# Patient Record
Sex: Male | Born: 1940 | Race: White | Hispanic: No | Marital: Married | Smoking: Never smoker
Health system: Southern US, Community
[De-identification: ages and names within clinical notes are randomized; demographics above are authoritative.]

## PROBLEM LIST (undated history)

## (undated) DIAGNOSIS — G20 Parkinson's disease: Secondary | ICD-10-CM

## (undated) DIAGNOSIS — N400 Enlarged prostate without lower urinary tract symptoms: Secondary | ICD-10-CM

## (undated) DIAGNOSIS — G20A1 Parkinson's disease without dyskinesia, without mention of fluctuations: Secondary | ICD-10-CM

## (undated) DIAGNOSIS — E7800 Pure hypercholesterolemia, unspecified: Secondary | ICD-10-CM

## (undated) DIAGNOSIS — I10 Essential (primary) hypertension: Secondary | ICD-10-CM

## (undated) DIAGNOSIS — F329 Major depressive disorder, single episode, unspecified: Secondary | ICD-10-CM

## (undated) DIAGNOSIS — J45909 Unspecified asthma, uncomplicated: Secondary | ICD-10-CM

## (undated) DIAGNOSIS — M419 Scoliosis, unspecified: Secondary | ICD-10-CM

## (undated) DIAGNOSIS — H409 Unspecified glaucoma: Secondary | ICD-10-CM

## (undated) DIAGNOSIS — F32A Depression, unspecified: Secondary | ICD-10-CM

## (undated) DIAGNOSIS — I519 Heart disease, unspecified: Secondary | ICD-10-CM

## (undated) HISTORY — PX: MITRAL VALVE REPAIR: SHX2039

## (undated) HISTORY — PX: HERNIA REPAIR: SHX51

## (undated) HISTORY — DX: Unspecified asthma, uncomplicated: J45.909

## (undated) HISTORY — DX: Scoliosis, unspecified: M41.9

## (undated) HISTORY — DX: Heart disease, unspecified: I51.9

## (undated) HISTORY — DX: Pure hypercholesterolemia, unspecified: E78.00

## (undated) HISTORY — DX: Major depressive disorder, single episode, unspecified: F32.9

## (undated) HISTORY — DX: Depression, unspecified: F32.A

## (undated) HISTORY — PX: REFRACTIVE SURGERY: SHX103

## (undated) HISTORY — DX: Benign prostatic hyperplasia without lower urinary tract symptoms: N40.0

---

## 1999-10-26 HISTORY — PX: CARDIAC CATHETERIZATION: SHX172

## 2000-06-03 ENCOUNTER — Ambulatory Visit (HOSPITAL_COMMUNITY): Admission: RE | Admit: 2000-06-03 | Discharge: 2000-06-03 | Payer: Self-pay | Admitting: Cardiology

## 2000-06-24 ENCOUNTER — Encounter: Payer: Self-pay | Admitting: Cardiothoracic Surgery

## 2000-06-29 ENCOUNTER — Inpatient Hospital Stay (HOSPITAL_COMMUNITY): Admission: RE | Admit: 2000-06-29 | Discharge: 2000-07-13 | Payer: Self-pay | Admitting: Cardiothoracic Surgery

## 2000-06-29 ENCOUNTER — Encounter: Payer: Self-pay | Admitting: Cardiothoracic Surgery

## 2000-06-29 ENCOUNTER — Encounter (INDEPENDENT_AMBULATORY_CARE_PROVIDER_SITE_OTHER): Payer: Self-pay | Admitting: Specialist

## 2000-06-30 ENCOUNTER — Encounter: Payer: Self-pay | Admitting: Cardiothoracic Surgery

## 2000-07-01 ENCOUNTER — Encounter: Payer: Self-pay | Admitting: Cardiothoracic Surgery

## 2000-07-02 ENCOUNTER — Encounter: Payer: Self-pay | Admitting: Cardiothoracic Surgery

## 2000-07-03 ENCOUNTER — Encounter: Payer: Self-pay | Admitting: Cardiothoracic Surgery

## 2000-07-05 ENCOUNTER — Encounter: Payer: Self-pay | Admitting: Cardiothoracic Surgery

## 2000-07-06 ENCOUNTER — Encounter: Payer: Self-pay | Admitting: Cardiothoracic Surgery

## 2000-07-08 ENCOUNTER — Encounter: Payer: Self-pay | Admitting: Cardiothoracic Surgery

## 2000-07-11 ENCOUNTER — Encounter: Payer: Self-pay | Admitting: Cardiothoracic Surgery

## 2000-07-31 ENCOUNTER — Encounter: Payer: Self-pay | Admitting: Cardiothoracic Surgery

## 2000-07-31 ENCOUNTER — Emergency Department (HOSPITAL_COMMUNITY): Admission: EM | Admit: 2000-07-31 | Discharge: 2000-07-31 | Payer: Self-pay | Admitting: Emergency Medicine

## 2000-08-01 ENCOUNTER — Encounter: Payer: Self-pay | Admitting: Cardiothoracic Surgery

## 2000-08-01 ENCOUNTER — Ambulatory Visit (HOSPITAL_COMMUNITY): Admission: RE | Admit: 2000-08-01 | Discharge: 2000-08-01 | Payer: Self-pay | Admitting: Cardiothoracic Surgery

## 2000-08-02 ENCOUNTER — Encounter (HOSPITAL_COMMUNITY): Admission: RE | Admit: 2000-08-02 | Discharge: 2000-10-31 | Payer: Self-pay | Admitting: Cardiology

## 2000-08-11 ENCOUNTER — Encounter: Admission: RE | Admit: 2000-08-11 | Discharge: 2000-08-11 | Payer: Self-pay | Admitting: Cardiothoracic Surgery

## 2000-08-11 ENCOUNTER — Encounter: Payer: Self-pay | Admitting: Cardiothoracic Surgery

## 2000-08-18 ENCOUNTER — Encounter: Admission: RE | Admit: 2000-08-18 | Discharge: 2000-08-18 | Payer: Self-pay | Admitting: Cardiothoracic Surgery

## 2000-08-18 ENCOUNTER — Encounter: Payer: Self-pay | Admitting: Cardiothoracic Surgery

## 2000-08-21 ENCOUNTER — Emergency Department (HOSPITAL_COMMUNITY): Admission: EM | Admit: 2000-08-21 | Discharge: 2000-08-21 | Payer: Self-pay | Admitting: Emergency Medicine

## 2000-11-01 ENCOUNTER — Encounter (HOSPITAL_COMMUNITY): Admission: RE | Admit: 2000-11-01 | Discharge: 2000-11-06 | Payer: Self-pay | Admitting: Cardiology

## 2000-11-07 ENCOUNTER — Encounter (HOSPITAL_COMMUNITY): Admission: RE | Admit: 2000-11-07 | Discharge: 2001-02-05 | Payer: Self-pay | Admitting: Cardiology

## 2001-02-06 ENCOUNTER — Encounter: Admission: RE | Admit: 2001-02-06 | Discharge: 2001-05-07 | Payer: Self-pay | Admitting: Cardiology

## 2001-05-08 ENCOUNTER — Encounter (HOSPITAL_COMMUNITY): Admission: RE | Admit: 2001-05-08 | Discharge: 2001-08-06 | Payer: Self-pay | Admitting: Cardiology

## 2001-08-07 ENCOUNTER — Encounter (HOSPITAL_COMMUNITY): Admission: RE | Admit: 2001-08-07 | Discharge: 2001-11-05 | Payer: Self-pay | Admitting: Cardiology

## 2003-05-09 ENCOUNTER — Ambulatory Visit (HOSPITAL_COMMUNITY): Admission: RE | Admit: 2003-05-09 | Discharge: 2003-05-09 | Payer: Self-pay | Admitting: Internal Medicine

## 2003-07-24 ENCOUNTER — Ambulatory Visit (HOSPITAL_COMMUNITY): Admission: RE | Admit: 2003-07-24 | Discharge: 2003-07-24 | Payer: Self-pay | Admitting: Gastroenterology

## 2003-07-24 ENCOUNTER — Encounter (INDEPENDENT_AMBULATORY_CARE_PROVIDER_SITE_OTHER): Payer: Self-pay | Admitting: *Deleted

## 2003-09-12 ENCOUNTER — Encounter: Admission: RE | Admit: 2003-09-12 | Discharge: 2003-12-11 | Payer: Self-pay | Admitting: Neurology

## 2003-12-19 ENCOUNTER — Ambulatory Visit (HOSPITAL_COMMUNITY): Admission: RE | Admit: 2003-12-19 | Discharge: 2003-12-19 | Payer: Self-pay | Admitting: Neurology

## 2004-07-10 ENCOUNTER — Encounter: Admission: RE | Admit: 2004-07-10 | Discharge: 2004-09-10 | Payer: Self-pay | Admitting: Neurology

## 2005-01-21 ENCOUNTER — Ambulatory Visit: Payer: Self-pay | Admitting: Internal Medicine

## 2005-08-06 ENCOUNTER — Ambulatory Visit: Payer: Self-pay | Admitting: Internal Medicine

## 2005-08-24 ENCOUNTER — Ambulatory Visit (HOSPITAL_COMMUNITY): Admission: RE | Admit: 2005-08-24 | Discharge: 2005-08-24 | Payer: Self-pay | Admitting: Neurology

## 2006-12-02 ENCOUNTER — Ambulatory Visit: Payer: Self-pay | Admitting: Internal Medicine

## 2007-11-09 DIAGNOSIS — J209 Acute bronchitis, unspecified: Secondary | ICD-10-CM

## 2007-11-10 ENCOUNTER — Ambulatory Visit: Payer: Self-pay | Admitting: Internal Medicine

## 2007-11-11 DIAGNOSIS — J309 Allergic rhinitis, unspecified: Secondary | ICD-10-CM | POA: Insufficient documentation

## 2008-02-26 ENCOUNTER — Telehealth: Payer: Self-pay | Admitting: Internal Medicine

## 2008-03-25 ENCOUNTER — Telehealth: Payer: Self-pay | Admitting: Internal Medicine

## 2008-10-09 ENCOUNTER — Ambulatory Visit: Payer: Self-pay | Admitting: Internal Medicine

## 2009-01-15 ENCOUNTER — Encounter: Admission: RE | Admit: 2009-01-15 | Discharge: 2009-04-15 | Payer: Self-pay | Admitting: Neurology

## 2009-02-24 ENCOUNTER — Ambulatory Visit: Payer: Self-pay | Admitting: Psychology

## 2009-09-10 ENCOUNTER — Encounter: Admission: RE | Admit: 2009-09-10 | Discharge: 2009-09-10 | Payer: Self-pay | Admitting: Orthopaedic Surgery

## 2009-10-08 ENCOUNTER — Encounter: Admission: RE | Admit: 2009-10-08 | Discharge: 2009-10-08 | Payer: Self-pay | Admitting: Neurology

## 2009-11-14 ENCOUNTER — Telehealth (INDEPENDENT_AMBULATORY_CARE_PROVIDER_SITE_OTHER): Payer: Self-pay | Admitting: *Deleted

## 2010-02-25 ENCOUNTER — Encounter: Admission: RE | Admit: 2010-02-25 | Discharge: 2010-05-26 | Payer: Self-pay | Admitting: Neurology

## 2010-04-22 ENCOUNTER — Encounter: Admission: RE | Admit: 2010-04-22 | Discharge: 2010-04-22 | Payer: Self-pay | Admitting: Orthopaedic Surgery

## 2010-11-18 ENCOUNTER — Encounter
Admission: RE | Admit: 2010-11-18 | Discharge: 2010-11-18 | Payer: Self-pay | Source: Home / Self Care | Attending: Internal Medicine | Admitting: Internal Medicine

## 2010-11-24 NOTE — Progress Notes (Signed)
Summary: Record request  Request for records received from [facility]. Request forwarded to [facility]. Wilder Glade  November 14, 2009 4:11 PM

## 2010-11-24 NOTE — Assessment & Plan Note (Signed)
Summary: FOLLOW-UP/TL.   Visit Type:  Follow-up PCP:  Kirby Funk  Chief Complaint:  follow up.  History of Present Illness: Asthma Allergic rhinitis   F/U OK. Goes weeks without wheeze, using rescue inhaler only occasionally. Parkinson's stable- Dr. Sandria Manly- some short term memory loss. Also wants to schedule sleep study at their program. Still sternal sore with sneeze at old sternotomy site Denies routine sneeze, cough or phlegm Denies cardiac complaints    Current Allergies (reviewed today): ! 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    Risk Factors:  Tobacco use:  never   Review of Systems      See HPI   Vital Signs:  Patient Profile:   70 Years Old Male Weight:      166.38 pounds O2 Sat:      99 % O2 treatment:    Room Air Pulse rate:   61 / minute BP sitting:   106 / 72  (left arm) Cuff size:   regular  Vitals Entered By: Reynaldo Minium CMA (November 10, 2007 9:20 AM)             Comments Medications reviewed with patient ..................................................................Marland KitchenReynaldo Minium CMA  November 10, 2007 9:20 AM     Physical Exam  General:     normal appearance.  affect brighter than in the past.  Head:     normocephalic and atraumatic facial expression a little flat. Nose:     no deformity, discharge, inflammation, or lesions Mouth:     no deformity or lesions Neck:     no masses, thyromegaly, or abnormal cervical nodes Chest Wall:     sternotomy scar Lungs:     clear bilaterally to auscultation and percussion Heart:     regular rate and rhythm, S1, S2 without murmurs, rubs,  gallops, or clicks Extremities:     no clubbing, cyanosis, edema, or deformity noted Skin:     intact without lesions or rashes Cervical Nodes:     no significant adenopathy     Impression & Recommendations:  Problem # 1:  ASTHMATIC BRONCHITIS, ACUTE (ICD-466.0) Assessment: Improved  His updated medication list for this problem includes:    Proventil Hfa 108 (90 Base) Mcg/act Aers (Albuterol sulfate) .Marland Kitchen... 2 puffs, four times daily as needed  Orders: Est. Patient Level III (56213)   Problem # 2:  ALLERGIC RHINITIS (ICD-477.9) Assessment: Improved  His updated medication list for this problem includes:    Fluticasone Propionate 50 Mcg/act Susp (Fluticasone propionate) .Marland Kitchen... 1 spray each nostril daily  Orders: Est. Patient Level III (08657)   Medications Added to Medication List This Visit: 1)  Fluticasone Propionate 50 Mcg/act Susp (Fluticasone propionate) .Marland Kitchen.. 1 spray each nostril daily 2)  Proventil Hfa 108 (90 Base) Mcg/act Aers (Albuterol sulfate) .... 2 puffs, four times daily as needed   Patient Instructions: 1)  Please schedule a follow-up appointment in 1 year.    Prescriptions: FLUTICASONE PROPIONATE 50 MCG/ACT  SUSP (FLUTICASONE PROPIONATE) 1 spray each  nostril daily  #3 x 3   Entered and Authorized by:   Waymon Budge MD   Signed by:   Waymon Budge MD on 11/10/2007   Method used:   Printed then faxed to ...         RxID:   [id number] PROVENTIL HFA 108 (90 BASE) MCG/ACT  AERS (ALBUTEROL SULFATE) 2 puffs, four times daily as needed  #3 x 3   Entered and Authorized by:   Waymon Budge MD   Signed by:   Waymon Budge MD on 11/10/2007   Method used:   Printed then faxed to ...         RxID:   [id number]  ]

## 2010-11-27 ENCOUNTER — Encounter (HOSPITAL_COMMUNITY): Payer: MEDICARE

## 2010-11-27 ENCOUNTER — Ambulatory Visit (HOSPITAL_COMMUNITY)
Admission: RE | Admit: 2010-11-27 | Discharge: 2010-11-27 | Disposition: A | Discharge status: Home / Self Care | Payer: MEDICARE | Source: Ambulatory Visit | Attending: Surgery | Admitting: Surgery

## 2010-11-27 DIAGNOSIS — Z01818 Encounter for other preprocedural examination: Secondary | ICD-10-CM | POA: Insufficient documentation

## 2010-11-27 LAB — CBC
MCH: 30.4 pg (ref 26.0–34.0)
MCHC: 33.2 g/dL (ref 30.0–36.0)
MCV: 91.8 fL (ref 78.0–100.0)
Platelets: 158 10*3/uL (ref 150–400)
RBC: 4.5 MIL/uL (ref 4.22–5.81)

## 2010-11-27 LAB — COMPREHENSIVE METABOLIC PANEL
Albumin: 3.7 g/dL (ref 3.5–5.2)
BUN: 20 mg/dL (ref 6–23)
CO2: 31 mEq/L (ref 19–32)
Calcium: 9.3 mg/dL (ref 8.4–10.5)
Chloride: 103 mEq/L (ref 96–112)
Creatinine, Ser: 0.93 mg/dL (ref 0.4–1.5)
GFR calc Af Amer: >60 mL/min (ref >60)
GFR calc non Af Amer: >60 mL/min (ref >60)
Total Bilirubin: 1 mg/dL (ref 0.3–1.2)

## 2010-11-27 LAB — SURGICAL PCR SCREEN: MRSA, PCR: NEGATIVE

## 2010-12-04 ENCOUNTER — Ambulatory Visit (HOSPITAL_COMMUNITY)
Admission: RE | Admit: 2010-12-04 | Discharge: 2010-12-04 | Disposition: A | Discharge status: Home / Self Care | Payer: MEDICARE | Source: Ambulatory Visit | Attending: Surgery | Admitting: Surgery

## 2010-12-04 ENCOUNTER — Other Ambulatory Visit (HOSPITAL_COMMUNITY): Payer: Self-pay

## 2010-12-04 DIAGNOSIS — G20A1 Parkinson's disease without dyskinesia, without mention of fluctuations: Secondary | ICD-10-CM | POA: Insufficient documentation

## 2010-12-04 DIAGNOSIS — D176 Benign lipomatous neoplasm of spermatic cord: Secondary | ICD-10-CM | POA: Insufficient documentation

## 2010-12-04 DIAGNOSIS — K4090 Unilateral inguinal hernia, without obstruction or gangrene, not specified as recurrent: Secondary | ICD-10-CM | POA: Insufficient documentation

## 2010-12-04 DIAGNOSIS — Z01812 Encounter for preprocedural laboratory examination: Secondary | ICD-10-CM | POA: Insufficient documentation

## 2010-12-04 DIAGNOSIS — J45909 Unspecified asthma, uncomplicated: Secondary | ICD-10-CM | POA: Insufficient documentation

## 2010-12-04 DIAGNOSIS — G20 Parkinson's disease: Secondary | ICD-10-CM | POA: Insufficient documentation

## 2010-12-07 NOTE — Op Note (Signed)
[name] [name], [name] [name]                ACCOUNT NO.:  [id number]  MEDICAL RECORD NO.:  [id number]           PATIENT TYPE:  O  LOCATION:  XRAY                         FACILITY:  [facility]  PHYSICIAN:  Ardeth Sportsman, MD     DATE OF BIRTH:  05/25/1941  DATE OF PROCEDURE: DATE OF DISCHARGE:  11/27/2010                              OPERATIVE REPORT   PRIMARY CARE PHYSICIAN:  Thora Lance, M.D.  NEUROLOGIST:  Genene Churn. Love, M.D.  ANESTHESIOLOGIST:  Hezzie Bump. Okey Dupre, M.D.  SURGEON:  Ardeth Sportsman, MD  ASSISTANT:  RN.  PREOPERATIVE DIAGNOSIS:  Right inguinal hernia.  POSTOPERATIVE DIAGNOSIS:  Right indirect inguinal hernia.  PROCEDURE PERFORMED:  Laparoscopic right inguinal hernia repair with mesh.  ANESTHESIA: 1. General anesthesia using total intravenous anesthesia method per     Dr. Okey Dupre and Dr. Imagene Gurney recommendations. 2. Right ilioinguinal/genitofemoral/spermatic cord nerve root blocks. 3. Local anesthetic in a field block around all port sites.  SPECIMENS:  None.  DRAINS:  None.  ESTIMATED BLOOD LOSS:  Minimal.  COMPLICATIONS:  None apparent.  INDICATIONS:  [name] [name] is a 70 year old retired gentleman who has had some right groin pain and swelling that is increased, became uncomfortable.  He had evaluation concerning for inguinal hernia.  The patient was sent to me and I confirmed that.  Anatomy and embryology of abdominal formation and testicular migration was discussed.  Pathophysiology of inguinal herniation with its natural risks were discussed.  Options discussed.  Recommendations made for hernia repair.  Laparoscopic versus open techniques were discussed.  I had a discussion with the patient and his wife, and his neurologist, Dr. Sandria Manly.  Given the fact that he had lot of discomfort, I was skeptical that local anesthetic could be sufficient, he probably will need to undergo general anesthesia.  They were understandably concerned with his Parkinson's disease  because I feel there is a postoperative advanced and decreased pain and narcotic use postoperatively doing laparoscopic technique.  I discussed with the neurologist and Dr. Sandria Manly who thought it would be reasonable.  Risks, benefits, and alternatives were discussed. Questions answered and agreed to proceed.  OPERATIVE FINDINGS:  He had moderate-sized indirect inguinal hernia with a spermatic cord lipoma lead point.  He had no direct femoral or obturator defects.  DESCRIPTION OF PROCEDURE:  Informed consent was confirmed.  The patient voided prior to going to the operating room.  He underwent general anesthesia without any difficulty.  He received IV antibiotics.  He was positioned supine with both arms tucked.  He had received his ReQuip and Sinemet, neurological medications this morning.  His abdomen and mons pubis were clipped, prepped and draped in sterile fashion.  Surgical time out confirmed the plan.  I placed a 12 mm Hasson port posterior to the left rectus abdominis muscle using a modified Hasson technique.  Port was placed in the preperitoneal plane.  Induced carbon dioxide insufflation, used the camera to free peritoneum off the lower abdomen.  I created a working space such that 5 mm ports were placed in the right and left mid- abdomen.  I turned attention towards the  right lower quadrant.  I freed the peritoneum off towards the right flank.  I freed the peritoneum up to the pubic rim and freed the right anteromedial bladder wall off the bladder of the true pelvic sidewall.  I can see a swath of peritoneum going up into a dilated internal ring, consistent with an indirect inguinal hernia.  I skeletonized the hernia sac off the spermatic cord structures and peeled down.  In peeling down, there was a very thin doubt area of peritoneum and there was breakdown and an opening in the peritoneal space.  I did confirm there was no incarceration or abnormality in the peritoneal  cavity itself. Eventually, I skeletonized the hernia sac off spermatic cord muscles and reduced it down.  There was a spermatic cord lipoma lead point that was reduced down with it, that was removed.  I peeled the hernia sac in the peritoneum off the cord structures and vas deferens and psoas muscle and retroperitoneum as proximally as possible.  I did free the peritoneum off the right midabdomen as well.  I did a high ligation of the hernia sac as well as closing the peritoneal opening using 3-0 Vicryl laparoscopic intracorporeal suturing, figure-of-eight x10.  I chose a 15 x 15 cm ultra lightweight polypropylene mesh (Ultrapro).  I cut a slit in the mesh such that a 6 x 6 cm medial and inferior corner rest in the true pelvis between the anterolateral bladder and the pelvic sidewall.  It laid in the diamond pattern such that the medial corner overlapped across the midline towards the left posterior to the superior ramus.  The mesh corners laid well laterally, superiorly, and inferiorly such that there was over 3 inches circumferential coverage around the internal ring.  Direct space and femoral and obturator foramen were covered with this as well.  I saw no other defects.  I held the mesh in place and capnoperitoneum was evacuated with ports. I did evacuate peritoneum out of the perirenal space by making a small nick in the peritoneum at the umbilical stalk away from the mesh.  I closed the infraumbilical fascial defect using 0-Vicryl stitch.  Skin was closed.  Steri-Strips were applied.  The patient was extubated and sent to recovery room in stable condition. Total anesthesia time was less than an hour.  I discussed postoperative care in detail with the patient and his wife in the office.  I discussed it in the holding area as well.  I have written instructions.  I am about to discuss this with his wife as well. We will get him back on his neurological/Parkinson's medical regimen  as soon as possible.     Ardeth Sportsman, MD     SCG/MEDQ  D:  12/04/2010  T:  12/05/2010  Job:  [id number]  Electronically Signed by Karie Soda MD on 12/07/2010 02:16:38 PM

## 2011-03-12 NOTE — Assessment & Plan Note (Signed)
[facility]                             PULMONARY OFFICE NOTE   NAME:[name], [name]                         MRN:          [id number]  DATE:12/02/2006                            DOB:          12-04-1940    PROBLEM:  1. Asthma/chronic obstructive pulmonary disease.  2. Parkinson's.   HISTORY:  Last seen in 2006.  He has had a previous mitral valve repair.  He and his wife returned from [place] last week with chest colds.  She  got over hers.  He has had more significant congestion, wheezing and  head congestion.  Dr. Valentina Lucks gave him prednisone 40 mg daily for 7  days, and an Asmanex inhaler, but his stuffy nose is now keeping her  awake.  I think that is what she is hearing rather than wheezing.  His  appetite is down just a little, but he has had no significant problems  from the prednisone, and he denies fever, chills, aches or phlegm.  There has been very minimal epistaxis.  Cough produces scant amounts of  trace yellow to white sputum.   MEDICATIONS:  1. Nasacort AQ.  2. Aspirin 81 mg.  3. Multivitamins.  4. Zoloft.  5. Toprol XL 25 mg.  6. Levbid 0.375 mg.  7. Flomax.  8. Requip 5 mg t.i.d.  9. Provigil 100 mg.  10.Lipitor 10 mg.  11.Asmanex 2 puffs daily.  12.He has 1 day left on his prednisone.  13.Rescue albuterol inhaler used occasionally.   NO MEDICATION ALLERGY, BUT HE AVOIDS ANTIHISTAMINES, WHICH TEND TO CAUSE  HALLUCINATION.  This was an issue in discussing cough medications.   OBJECTIVE:  Weight 160 pounds.  BP 128/70.  Pulse regular 59.  Room air  saturation 97%.  There is mild nasal congestion and a congested cough, otherwise lungs  are quiet and breathing is unlabored.  HEART SOUNDS:  Regular.  No edema.  No stridor.   IMPRESSION:  Rhinitis and bronchitis with a viral syndrome.   PLAN:  1. Z-Pak with discussion, to hold.  2. Mucinex.  3. Short-term use of Afrin or nasal strips at night if necessary.  4. Saline  nasal lavage.  5. Tessalon Pearls 100 mg q. 6 hours p.r.n. for cough.  6. Schedule return in 1 year, earlier p.r.n.     Clinton D. Maple Hudson, MD, Tonny Bollman, FACP  Electronically Signed    CDY/MedQ  DD: 12/03/2006  DT: 12/03/2006  Job #: [id number]   cc:   Thora Lance, M.D.

## 2011-03-12 NOTE — Consult Note (Signed)
[facility]  Patient:    [name] [name], [name] [name]                         MRN: [id number] Adm. Date:  [id number] Attending:  Waldo Laine CC:         Gwenith Daily. Tyrone Sage, M.D.  Thora Lance, M.D.   Consultation Report  REFERRING PHYSICIAN:  Gwenith Daily. Tyrone Sage, M.D.  REASON FOR CONSULTATION:  Postoperative dyspnea.  HISTORY OF PRESENT ILLNESS:  This is a very complicated 70 year old white male never-smoker, who carries a diagnosis of asthma and has previously been followed by Dr. Stevphen Rochester but has not seen him for over five years, when the patient declined taking further allergy shots.  He has been maintained on Serevent two puffs b.i.d. with rare flare-ups, the last being over a year ago, requiring a course of prednisone.  He is now six days status post mitral valve replacement with normal postoperative hemodynamics and worsening dyspnea with a sensation of "my asthma is bad" over the last several days.  He is 50% better after using a nebulizer treatment with albuterol but no more than that. He has a dry cough with deep inspiratory maneuvers but no pleuritic pain and no associated leg swelling.  He also has orthopnea with immediate PND.  He denies any nocturnal wheeze and has no sputum production, sinus complaints, or reflux symptoms.  I note that preoperatively he had a wedge pressure of 12 with a V-wave of only 18, and PA pressure of 28/11.  PAST MEDICAL HISTORY: 1. Asthma as noted above. 2. Detached retina requiring surgery remotely. 3. Hypertension. 4. Allergic rhinitis. 5. BPH. 6. Status post tonsillectomy and adenoidectomy as an infant.  MEDICATIONS:  Prior to admission, he was maintained on Serevent two puffs b.i.d., Cardura 4 mg q.d., and Rhinocort one or two q.d.  Medications were reviewed and at this point include Lopressor 25 mg b.i.d., iron sulfate 325 mg b.i.d., Colace one daily, hyoscyamine 0.375 mg q.d., Dulcolax  one daily, Pepcid 20 mg b.i.d., Epogen 40,000 units every Thursday, and Cipro 500 mg b.i.d. started on the 10th.  ALLERGIES:  No known medical allergies.  He is "allergic to lots of things," according to previous allergy testing.  SOCIAL HISTORY:  He has never smoked.  He does have a dog inside.  FAMILY HISTORY:  Negative for atopy or respiratory diseases.  REVIEW OF SYSTEMS:  Taken in detail and essentially negative except as noted above.  PHYSICAL EXAMINATION:  GENERAL:  This is an anxious but pleasant white male in no acute distress.  VITAL SIGNS:  He is afebrile with normal vital signs.  His T-max over the last 24 hours is 100.3.  HEENT:  Unremarkable.  Oropharynx is clear.  No evidence of postnasal drainage or cobblestoning.  NECK:  Supple without cervical adenopathy or tenderness.  Trachea is midline.  LUNGS:  Lung fields reveal relatively short inspiratory and expiratory time with no significant expiratory prolongation or wheezing.  There is dullness in both bases with cough elicited on deep inspiratory maneuvers.  CARDIAC:  Regular rate and rhythm with a 1/6 systolic ejection murmur.  ABDOMEN:  Soft and benign with no palpable ascites and negative Hoover sign.  EXTREMITIES:  Warm without calf tenderness, cyanosis, clubbing, or edema.  NEUROLOGIC:  No focal deficits or pathologic reflexes, although his affect was quite anxious.  SKIN:  Warm and dry.  DIAGNOSTIC EVALUATION:  Chest x-ray was reviewed from  today and reveals moderate bilateral pleural effusions.  Hematocrit is 23% today with no elevation of WBC, and BMET is normal, with an INR of 2.2.  His most recent hematocrit was 21.9% obtained on September 9.  The hemoglobin saturation presently is 96% on 2 L nasal prongs.  IMPRESSION: 1. Chronic mild asthma, previously controlled on Serevent two puffs b.i.d.,    with rare flare-ups.  In retrospect, he has done quite well.  However, I    think there was  probably a misunderstanding between the patient and Dr.    Stevphen Rochester (although the patient denies this) in that Serevent is not    typically used by itself because of the risk of hiding inflammation and    setting him up for asthma flare-ups.  Clearly he has been fortunate not to    have had more flare-ups requiring prednisone over the last year.  On the    other hand, I do not believe he is having a significant flare-up now and do    not believe aggressive treatment for asthma is needed.  I would recommend,    however, for future reference using Serevent combined with fluticasone in    the form of Advair one puff b.i.d. dosed for now at 250/51 b.i.d. but    certainly could be tapered down to the 100/50 b.i.d. dosing on his next    refill (which I will defer to Dr. Valentina Lucks unless the patient wishes    outpatient follow-up through our office). 2. Dyspnea I believe is disproportionate to air flow obstruction.  Note that    he only gets "half better" from high-dose albuterol and is not wheezing on    my present exam two hours after his last treatment.  I suspect the largest    component of dyspnea is related to anxiety, anemia, and pleural effusions. 3. Pleural effusion postoperatively, question etiology.  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  I do not    think there is any evidence, however, for congestive heart failure or    pulmonary embolism or pneumonia.  I recommend simply following his    sedimentation rate, doing incentive spirometry with continued    normalization, and following him clinically for now. 4. Postoperative atelectasis secondary to effusions is most likely cause of    persistent O2 dependency.  I would not treat this specifically other than    measures as above.  Ultimately, if he continues to have effusions prior to    discharge and hypoxemia, he  may benefit from therapeutic and diagnostic     thoracentesis.  However, I will defer this issue entirely to Dr. Ysidro Evert    capable hands. DD:  07/06/10 TD:  07/06/00 Job: [id number]

## 2011-03-12 NOTE — Discharge Summary (Signed)
[name]. [name]  Patient:    [name] [name], [name] [name]                         MRN: [id number] Adm. Date:  [id number] Disc. Date: 07/08/00 Attending:  Waldo Laine Dictator:   Loura Pardon, P.A.                           Discharge Summary  DATE OF BIRTH:  1941-01-06  CARDIOLOGIST:  Francisca December, M.D.  PRIMARY CAREGIVER:  Thora Lance, M.D.  PULMONOLOGIST:  Charlaine Dalton. Sherene Sires, M.D.  FINAL DIAGNOSES: 1. Severe mitral regurgitation (4+) with mitral prolapse. 2. Acute blood loss anemia postoperatively in part secondary to coagulopathy    requiring transfusions 4 units packed fresh frozen plasma, 10 units of    platelets on the day of surgery, 2 units of packed red blood cells, and 8    units of platelets on postoperative day #1. 3. Bilateral moderate pleural effusions, occasionally prolonged oxygen    support. 4. Sputum culture abundant Gram positive cocci in chains treated with Cipro. 5. Postoperative anxiety and mild confusion/hallucination with narcotic    analgesia.  SECONDARY DIAGNOSES: 1. Asthma/bronchospastic lung disease. 2. History of detached retina requiring surgery on both eyes 1999. 3. History of cataracts. 4. Hypertension. 5. Allergic rhinitis. 6. Benign prostatic hypertrophy. 7. Status post tonsillectomy and adenoidectomy. 8. Status post left heart catheterization June 03, 2000.  The study    demonstrated ejection fraction of 79%, 4+ mitral regurgitation, mitral    prolapse.  The left main was free of disease.  The left anterior descending    coronary artery had a 30% proximal stenosis in the left circumflex and    right coronary artery was normal.  PROCEDURES:  June 29, 2000 placement of 29 mm ______ annuloplasty ring-Dr. Sheliah Plane.  Patient did experience coagulopathy in the immediate postoperative period.  Required transfusions as mentioned above.  DISCHARGE DISPOSITION:  [name] [name] is judged a suitable  candidate for discharge postoperative day #8 as mentioned above.  He did require transfusions of blood products due to severe acute blood loss anemia. Occasioned intraoperatively and in the first day after surgery.  He was treated with Epogen injections twice seven days apart and with iron supplementation.  His hemoglobin reached a nadir which means low point of 7.7 on postoperative day #4.  It has been slowly increasing in the ensuing days. He has not experienced any cardiac dysrhythmias in the postoperative period. He did have a cardiac murmur which is now resolved after placement of annuloplasty ring.  He has required prolonged oxygen support which to all intents and purposes could be removed on postoperative day #8.  He is achieving 97% oxygen saturation on 2 L of nasal cannula.  When ambulating he is able to maintain 90-92% oxygen saturations on room air.  His gait is steady.  He is ambulating without assistance.  His appetite is still poor and he describes some trouble swallowing.  He says that he has had to take his pills with sherbet or pudding in order for them to be swallowed.  His voice is slightly hoarse.  He complains of a sore throat in the postoperative period. He has not had trouble swallowing preoperatively, however.  His swallow function has seemed to improve in the last two days prior to discharge.  He had elevated temperature on postoperative day #5  with productive green sputum. The sputum was cultured and demonstrated abundant Gram positive cocci in chains.  He was started on Cipro and will continue Cipro for an 11 day treatment.  He was seen by Casimiro Needle B. Sherene Sires, M.D., pulmonologist, in consultation.  On postoperative day #7 prolonged oxygen requirement is secondary in his opinion to chronic mild asthma.  Dr. Sherene Sires recommended using Serevent combined with fluticasone in the form of Advair one puff b.i.d.  It is also noted that he had moderate bilateral pleural effusions  which have, however, seemed to diminish on chest x-rays taken postoperative day #7 and 8. It is thought that he might have a hemothorax since he has history of falling hematocrit postoperatively.  There is no evidence for congestive heart failure, pulmonary embolism, or pneumonia.  Dr. Sherene Sires recommended following sedimentation rate, performing ______ spirometry, mobilizing, and following clinically.  It was his opinion that postoperative atelectasis was secondary to bilateral pleural effusions.  If he continues to have hypoxemia it was thought that [name] [name] might benefit from a therapeutic thoracentesis.  After his initial temperature elevation on postoperative day #5, he has not had any further febrile episodes.  [name] [name] was started on Coumadin for a three month period on postoperative day #3 and achieved therapeutic doses of Coumadin by postoperative day #7.  His Coumadin dose is 2.5 daily unless changed by dictation of Francisca December, M.D., cardiologist.  [name] [name] will report to Dr. Amil Amen office on Monday, September 17 to have anticoagulation levels in his blood checked and they will be checked serially until he obtains a stable PT/INR.  DISCHARGE ACTIVITY:  Ambulation as tolerated.  He is asked not to lift more than 10 pounds nor to drive for the next six weeks.  DISCHARGE MEDICATIONS:  1. Ultram 50 mg one to two tabs p.o. q.4-6h. p.r.n. pain.  2. Coumadin 2.5 mg daily or as directed by Dr. Amil Amen office.  3. Ferrous sulfate 325 mg p.o. b.i.d. with food.  4. Lopressor 50 mg one-half tab in the morning, one-half tab in evening.  5. Folic acid 1 mg daily.  6. Pepcid 20 mg p.o. b.i.d.  7. Cipro 500 mg b.i.d. for seven days of treatment.  8. Cardura 4 mg at bedtime daily.  9. Advair diskus 250/50 one puff b.i.d. 10. Humibid LA 600 mg two tabs p.o. b.i.d. 11. Ativan as needed for anxiety. 12. Rhinocort spray as needed.  Question of further diuresis with Lasix will be  postponed until the day of his discharge.  He has achieved preoperative weight and it is not felt that he has impaired left ventricular function or history of congestive heart failure to  justify continuing on a diuretic at present.  DISCHARGE DIET:  Low sodium, low cholesterol diet.  WOUND CARE:  He may shower daily, keeping his incisions clean and dry.  FOLLOW-UP:  He will see Dr. Amil Amen Monday, September 19 to have blood tested for anticoagulation levels.  He will also have an office visit with Dr. Amil Amen two weeks after discharge and he is asked to call [phone] to arrange that appointment.  Dr. Ysidro Evert office will call him with an appointment for three weeks after discharge.  He is asked to bring a chest x-ray taken at Dr. Amil Amen office to that appointment.  BRIEF HISTORY:  [name] [name] is a 70 year old male referred by Dr. Amil Amen to Dr. Sheliah Plane for management of mitral valve disease on Mar 24, 2000. Patient presented to the office of cardiovascular  thoracic surgeons seeking a second opinion regarding his mitral valve regurgitation.  He is a relatively asymptomatic male with known moderate mitral regurgitation and long-standing mitral valve prolapse.  He has been followed for the last year with a series of echocardiograms.  They have showed a late serial increase in the left ventricular end-diastolic dimension.  His ejection fraction is 65%.  His only symptoms at his visit on May 31 were a mild increasing dyspnea when playing tennis.  Patient has undergone left heart catheterization which was done August 10.  The results have been dictated above; 4+ mitral regurgitation was demonstrated, a severe regurgitation.  Also, with the finding of non-obstructive atherosclerotic coronary artery disease.  There was a proximal 30% narrowing of the LAD.  [name] [name] was interested in proceeding with surgery.  He is interested in donating blood for autologous blood  transfusions which has been arranged.  Surgery is scheduled for September 5.  HOSPITAL COURSE:  [name] [name] [facility] September 5.  He underwent placement of 29 mm ______ annuloplasty ring the same day by Dr. Sheliah Plane.  He was placed on a renal dose dopamine intraoperatively.  In the immediate postoperative period his cardiac index was 4.  He had increased chest tube output.  Postoperatively his INR was 2.5.  His platelets were 76,000.  He was given 4 units of fresh frozen plasma and 10 units of platelets.  He was also transfused on the day of surgery with a further 2 units of packed red blood cells and 8 units of platelets.  He was also given protamine in the intensive care unit.  On postoperative day #1 no cardiac murmur was noted.  Dopamine and the ventilator were both weaned on postoperative day #1.  The INR had dropped to 1.7 after fresh frozen plasma had been given.  He was started on Serevent and albuterol nebulizing inhalers. On postoperative day #1 he was transfused with a further 1 unit of packed blood cells, obtaining 3 units of packed red blood cells all together.  He was also given Epogen 40,000 subcu injection which would be repeated one week later.  He was started on iron supplementation.  On postoperative day #2 he was started on folic acid.  Chest x-ray demonstrated bilateral pleural effusions.  Hematocrit was 24%, hemoglobin 8.5.  He was in a sinus rhythm and continued in a sinus rhythm throughout his hospitalization.  Postoperative day #3 he was achieving 95-97% oxygen saturation on 2 L of nasal cannula.  His hemoglobin had dropped to 7.6, hematocrit 21%.  He was started on Coumadin therapy which would last for three months.  Postoperative day #4 INR was 1.5, hemoglobin stable at 7.6, creatinine 1.0.  He was started on Chloraseptic oral spray for sore throat and hoarseness.  Chest x-ray showed moderate pleural effusions and  bibasilar atelectasis.  He was hallucinating on the Percocet analgesia and this was changed to Ultram.  He was also started on his home dose of Ativan for anxiety.  Postoperative day #5 INR declined to 1.7.  His temperature had also become febrile with a temperature of 101.7 max.  He was producing green sputum.  It was cultured.  Culture demonstrated abundant Gram positive cocci in chains on Gram stain.  He was started on Cipro which would continue for an 11 day treatment.  Postoperative day #6 he was achieving 93% oxygen saturation on 2 L nasal cannula.  Hemoglobin had climbed to 8, hematocrit 23.1%.  INR was 2.2.  Coumadin was held that day.  He was seen by Dr. Sandrea Hughs in consultation and started on Advair 250/50 b.i.d. with the knowledge that if his oxygen dependency continued or worsened that he might need thoracentesis.  Postoperative day #7 INR was 1.9.  He had had a bowel movement satisfactory for the first time since surgery.  Oxygen saturation was 93% on 2 L nasal cannula.  Moderate pleural effusions seemed to improve on chest x-ray.  Postoperative day #8 INR was stable at 1.9.  He was achieving 90% oxygen saturation on room air on 2 L of nasal cannula he would achieve 97% oxygen saturation.  He was ambulating independently.  His mental status was clear.  He answered questions appropriately; however, to guard against polypharmacy in a narcotic manner, he was taken off Robitussin which contains codeine.  He was switched to Humibid to clear tracheobronchial secretions.  A CBC and a BMET were obtained for postoperative day #9, the day of his discharge, as well as a chest x-ray PA and lateral.  He was judged a suitable candidate for discharge postoperative day #9.  DISCHARGE MEDICATIONS:  As dictated above. DD:  07/07/00 TD:  07/10/00 Job: [id number]

## 2011-03-12 NOTE — Op Note (Signed)
   [name], [name] NO.:  [id number]   MEDICAL RECORD NO.:  [id number]                   PATIENT TYPE:  AMB   LOCATION:  ENDO                                 FACILITY:  [facility]   PHYSICIAN:  Danise Edge, M.D.                DATE OF BIRTH:  [birth year]   DATE OF PROCEDURE:  07/24/2003  DATE OF DISCHARGE:                                 OPERATIVE REPORT   PROCEDURE:  Colonoscopy.   INDICATIONS FOR PROCEDURE:  Mr. [name] [name] is a 69 year old male born  23-Apr-1941. [name] [name] is scheduled to undergo his first screening  colonoscopy with polypectomy to prevent colon cancer.   ENDOSCOPIST:  Charolett Bumpers, M.D.   PREMEDICATION:  Versed 10 mg, Demerol 50 mg .   [name] [name] has undergone mitral valve repair in the past and received a  combination of ampicillin and gentamycin prior to undergoing colonoscopy.   DESCRIPTION OF PROCEDURE:  After obtaining informed consent, [name] [name] was  placed in the left lateral decubitus position. I administered intravenous  Demerol and intravenous Versed to achieve conscious sedation for the  procedure. The patient's blood pressure, oxygen saturation and cardiac  rhythm were monitored throughout the procedure and documented in the medical  record.   Anal inspection was normal. Digital rectal exam revealed a non-nodular  prostate. The Olympus adjustable pediatric colonoscope was introduced into  the rectum and advanced to the cecum. A normal appearing ileocecal valve was  intubated and the distal ileum inspected.  Colonic preparation for the exam  today was excellent.   RECTUM:  Normal.   SIGMOID COLON AND DESCENDING COLON:  Left colonic diverticulosis. At 40 cm  from the anal verge, a diminutive 0.5 mm sessile polyp was removed with the  cold biopsy forceps.   SPLENIC FLEXURE:  Normal.   TRANSVERSE COLON:  Normal.   HEPATIC FLEXURE:  Normal.   ASCENDING COLON:  Normal.   CECUM AND ILEOCECAL  VALVE:  Normal.   DISTAL ILEUM:  Normal.   ASSESSMENT:  1. Left colonic diverticulosis.  2. A diminutive polyp was removed from the left colon at 40 cm from the anal     verge and submitted for pathologic interpretation.   RECOMMENDATIONS:  If colon polyp returns neoplastic pathologically, Mr.  [name] should undergo a repeat colonoscopy in five years.                                               Danise Edge, M.D.    MJ/MEDQ  D:  07/24/2003  T:  07/24/2003  Job:  [id number]   cc:   Thora Lance, M.D.  [address]  [place]  [place] [address]  Fax: [phone]

## 2011-03-12 NOTE — Discharge Summary (Signed)
[place]. [facility]  Patient:    [name], [name]                         MRN: [id number] Adm. Date:  [id number] Disc. Date: [id number] Attending:  Waldo Laine Dictator:   Loura Pardon, P.A.                           Discharge Summary  Please send discharge summary and addendum, which were both dictated on July 12, 2000, to Barron Alvine, M.D.. DD:  07/13/00 TD:  07/14/00 Job: [id number]

## 2011-03-12 NOTE — Op Note (Signed)
[place]. [facility]  Patient:    [name] [name], [name] [name]                         MRN: [id number] Proc. Date: 06/29/00 Adm. Date:  [id number] Attending:  Waldo Laine CC:         Francisca December, M.D.   Operative Report  PREOPERATIVE DIAGNOSIS:  Severe mitral regurgitation.  POSTOPERATIVE DIAGNOSIS:  Severe mitral regurgitation.  OPERATION:  Mitral valve repair with quadrangular resection of the posterior leaflet.  Placement of #29 St. Jude Taylor annuloplasty ring and placement of artificial chordae tendineae.  SURGEON:  Gwenith Daily. Tyrone Sage, M.D.  FIRST ASSISTANT:  Lissa Merlin, P.A.  BRIEF HISTORY:  The patient is a 70 year old male who over the past six months has been noted to have severe mitral regurgitation.  Cardiac catheterization was performed which demonstrated no concomitant coronary artery disease. Mitral valve repair or replacement was recommended to the patient, who agreed and signed informed consent.  DESCRIPTION OF PROCEDURE:  With Swan-Ganz and arterial line monitors in place, the patient underwent general endotracheal anesthesia without incident.  A transesophageal echocardiogram probe was placed which showed severe mitral regurgitation particularly with a redundant posterior leaflet with an anteriorly directed jet.  Skin was prepped and draped in the usual sterile manner.  A median sternotomy was performed.  The pericardium was opened.  The patient was systemically heparinized.  Superior and inferior vena cava cannulas were placed.  The patient was placed under cardiopulmonary bypass 2.5 liters per minute per meters squared.  The body temperature was cooled to 30 degrees.  Aortic cross clamp was applied and 500 cc of cold blood potassium cardioplegia was administered antegrade.  Retrograde cardioplegia catheter was also placed.  The left atrium was opened along the left intra-atrial groove and with proper retraction good visualization  of the mitral valve was obtained.  Upon inspection of the valve, the patient had very little calcification of the valve or the annulus.  A portion of the middle scalp of the posterior leaflet particularly toward the anterior commissure was redundant and though without ruptured cords was flail.  A quadrangular resection was done of this area.  The annulus was incised for a 29 St. Jude Taylor flexible angioplasty ring.  A single area along the anterior leaflet in the middle scallop had some elongated chordae.  A single 5-0 Gore-Tex suture with pledgets were sutured to the posterior meatal papillary muscle and carefully sutured to the edge of the anterior leaflet.  The repair of the posterior leaflet was then completed using interrupted 5-0 Ethibon sutures with a 2-0 Ethibon suture at the annulus.  Using 2-0 Ethibon suture, some circumferentially, the 29 annuloplasty ring was sutured in place.  With this completed, hydrostatic pressure on the valve revealed no evidence of regurgitation.  The chest cavity was flooded with CO2.  The heart was allowed to passively fill, to deair and the atrium closed with horizontal mattress 3-0 Prolene suture.  The head was put in a down position and the atrium deaired and closed.  Aortic cross clamp was removed.  Total cross clamp time was 110 minutes.  The patient had prompt rise of his myocardial septal temperature. He required electrical defibrillation to return to a sinus rhythm with the body temperature rewarmed to 37 degrees.  He was then ventilated and weaned from cardiopulmonary bypass without difficulty.  He remained hemodynamically stable and he was decannulated in the usual fashion.  Protamine sulfate was administered with total pump time of 170 minutes.  Two atrial and two ventricular pacing wires were applied.  Pericardium was reapproximated.  The sternum was closed with #6 stainless wire.  Fascia was closed with interrupted 0 Vicryl, running 3-0  Vicryl in subcutaneous tissue and 4-0 subcuticular stitch in the skin edges.  Dry dressings were applied.  Sponge and needle count was reported as correct at the conclusion of the procedure.  The patient tolerated the procedure without obvious complication and was transferred to the surgical intensive care for further postoperative care. DD:  07/04/00 TD:  07/04/00 Job: [id number]

## 2011-03-12 NOTE — Cardiovascular Report (Signed)
[place]. [facility]  Patient:    [name] [name], [name] [name]                         MRN: [id number] Proc. Date: 06/03/00 Adm. Date:  [id number] Disc. Date: [id number] Attending:  Corliss Marcus CC:         Thora Lance, M.D.  Gwenith Daily Tyrone Sage, M.D.  Cardiac Cath Lab   Cardiac Catheterization  INDICATIONS: Mr. Neece is a 70 year old man with severe mitral reguritation who has now elected to proceed with valve repair or replacement.  He is asymptomatic with respect to his mitral reguritation, but there has been slight left ventricular dilatation over the past year followed by serial echocardiograms.  He is brought, therefore, to the cardiac catheterization laboratory to assess the extent of disease and evaluate for possible CAD in preparation for undergoing elective valve repair or replacement.  DESCRIPTION OF PROCEDURE: Right and left heart catheterizations were performed following the percutaneous insertion of a 5 French catheter sheath into the right femoral artery and a 6 French catheter sheath into the right femoral vein.  A 6 French 100 cm multipurpose A-1 catheter was passed through the right heart chambers to the pulmonary artery wedge position.  Pressure was recorded with a catheter in the pulmonary artery wedge position, the main pulmonary artery, the right ventricle, and the right atrium.  Blood samples for oxygen saturation determination were obtained from the superior vena cava and the main pulmonary artery as well as simultaneously from the ascending aorta via a 110 cm pigtail catheter advanced through the 5 French arterial sheath.  Left ventriculography was then performed using the 5 French pigtail catheter following recording of simultaneous pulmonary artery wedge pressure and left ventricular end diastolic pressure.  Two separate left ventriculograms were performed using a power injector.  45 cc were injected during each procedure.  Coronary  angiography was then performed using 5 French #4 left and right Judkins catheters.  Cine angiography of each coronary artery was conducted in multiple LAO and RAO projections.  At the completion of the procedure, there was found to be a borderline significant stepup in the oxygen saturation from the superior vena cava to the main pulmonary artery. Therefore, the 6 French catheter sheath in the femoral vein was exchanged over a guiding J wire for an 8 [place] catheter sheath.  A 7.5 French balloon flow-directed Swan-Ganz catheter was then used to perform the right heart catheterization and blood samples for oxygen saturation determination were obtained from the superior vena cava, the right atrium, inferior vena cava, right ventricle, and right atrium.  At the completion of the procedure, the catheters and catheter sheaths were removed.  Hemostasis was achieved by direct pressure.  The patient was transported to the recovery room in stable condition with intact distal pulses.  Fluoroscopy time was 14.4 minutes and total contrast utilized was approximately 200 cc of Omnipaque.  HEMODYNAMICS: Utilizing the Fick principle and an estimated oxygen consumption of 212 ml per minute, the calculated cardiac output was 5.9 L per minute and index 3.2 L/minute per sq m.  The cardiac output by hermodilution was 5.4 L/minute and index 2.9 L/minute per sq m. There was no systolic gradient across the aortic valve, nor was there a diastolic gradient across the mitral valve.  Right heart pressures were as follows; right atrial pressure was A wave 5 mmHg, V wave 6 mmHg, mean 5 mmHg.  Right ventricular pressure 35/8 mmHg.  Pulmonary artery pressure 28/11 mmHg, mean of 19 mmHg. Pulmonary capillary wedge pressure A wave 12 mmHg, V wave 18 mmHg, mean 12 mmHg.  Left ventricular end-diastolic pressure 9 mmHg.  Systemic aortic pressure 134/76 with a mean of 99 mmHg.  Pulmonary vascular resistance was calculated to be  103 dynes-sec-cm-5.  ANGIOGRAPHY: The left ventriculogram demonstrated normal left ventricular size and global systolic function with a calculated ejection fraction utilizing a single-plane cine method of 79%.  There were no regional wall motion abnormalities.  There was 4+ mitral reguritation noted.  There was prolapse of the mitral valve noted.  There was no coronary calcification seen.  There was a right dominant coronary system present.  The main left coronary artery was normal.  The left anterior descending was mildly diseased and had a 30% proximal stenosis present.  The remainder of the vessel was without any evidence of occlusion.  The left circumflex artery and its branches were normal.  The right coronary artery and its branches were normal.  Collateral vessels were not seen.  FINAL IMPRESSION: 1. Normal right heart pressures. 2. Normal cardiac output. 3. Severe mitral reguritation. 4. Normal left ventricular systolic function. 5. Nonobstructive single vessel atherosclerotic coronary vascular disease.  PLAN: Would recommend the patient proceed with planned mitral valve replacement. DD:  06/03/00 TD:  06/03/00 Job: [id number]

## 2011-03-12 NOTE — Procedures (Signed)
[place]. [facility]  Patient:    [name] [name], [name] [name]                         MRN: [id number] Proc. Date: 06/29/00 Adm. Date:  [id number] Attending:  Waldo Laine CC:         Guadalupe Maple, M.D.   Procedure Report  PROCEDURE: Intrauterine pregnancy transesophageal echocardiography.  ANESTHESIOLOGIST: Guadalupe Maple, M.D.  INDICATIONS FOR PROCEDURE: [name] is a 70 year old white male with a history of severe mitral regurgitation, who is scheduled for mitral valve repair or replacement.  Intraoperative transesophageal echocardiography was requested to evaluate the extent of the mitral valve disease and to determine if any other valvular pathology was present, to assess the function of the right and left ventricles, and also to service as a monitor for intraoperative volume status.  DESCRIPTION OF PROCEDURE: The patient was brought to the operating room at [facility] and general anesthesia induced without difficulty.  The trachea was intubated reasonably.  The transesophageal echocardiography probe was then inserted into the esophagus without difficulty.  IMPRESSION: Prebypass findings of:  1. Moderate to severe mitral regurgitation.  There was prolapse of the mid     portion of the posterior leaflet of the middle scout, and there was some     redundancy of the anterior leaflet, but no prolapse.  The leaflets were     thickened.  There was jet of mitral regurgitation which tracked along     the surface of the anterior leaflet of the mitral valve.  The severity of     the mitral regurgitation was evident by a proximal isovelocity surface     area at the area of regurgitation.  There was also blunting of the     systolic component of flow in the left upper pulmonary vein and reversal     of flow in the systolic component of the right upper pulmonary vein by     pulse Doppler tracing.  There was no torn chorda evident.  2. Moderate  left ventricular dilatation with normal left ventricular function     with ejection fraction estimated at approximately 60%.  3. Left atrial enlargement, with no evidence of thrombus in the left atrium     or left atrial appendage.  4. No appearing aortic valve, trileaflet and structure which opened normally     with no evidence of aortic insufficiency.  5. Intact anteroatrial septum.  6. Normal appearing right ventricular function.  7. Trace tricuspid insufficiency.  8. Trace pulmonic regurgitation.  9. Post bypass findings of:     a. Status post mitral valve repair using annuloplasty ring.  The        anterior leaflet was mobile and was acting in the usual trapdoor        fashion in a status post annuloplasty ring patient.  There was trace        residual mitral regurgitation noted.  There was no prolapse of        fluttering of the mitral valve, and no torn chorda evident.  There        was no systolic anterior motion of the anterior leaflet of the        mitral valve.  Pulse wave interrogation of the left upper pulmonary        vein showed a normal wave form with higher systolic than diastolic  flow and both were forward.      b. Normal left ventricular function.  There was good contractility of         the left ventricle and normal left ventricular size, which is improved         from the prebypass findings.      c. Normal appearing aortic valve.  Trileaflet and structure opened         normally without evidence of aortic insufficiency.      d. Right ventricular function again appeared normal. DD:  06/29/00 TD:  06/29/00 Job: [id number]

## 2011-03-12 NOTE — Discharge Summary (Signed)
[place]. [facility]  Patient:    [name] [name], [name] [name]                         MRN: [id number] Adm. Date:  [id number] Disc. Date: [id number] Attending:  Waldo Laine Dictator:   Loura Pardon, P.A. CC:         Francisca December, M.D.  Thora Lance, M.D.  Charlaine Dalton. Sherene Sires, M.D. [facility]   Discharge Summary  DATE OF BIRTH:  [birth year]  ADDENDUM:  Covers period from September 14 to July 13, 2000.  1. Discharge was postponed secondary to continued cough with increased sputum    production. 2. Swallowing difficulty evaluated by modified barium swallow.  At discharge,    swallowing had improved as had the cough.  He was taken off Cipro. Since he has a prior history of bronchospastic lung disease, he was also taken off Lopressor with some improvement in his respiratory function.  He was also placed on Advair Diskus 250/50.  The patients discharge was postponed secondary to his difficulty swallowing. He underwent modified barium swallow on September 14.  This study demonstrated that the patient was given thin, thick, and pureed barium.  All consistencies demonstrated moderate pooling in vallecula and in the piriform sinuses.  There was trace penetration with both thin and thick liquid which seemed to be possibly spilling over from the piriform sinuses, no aspiration observed.  The patient did cough intermittently, suggesting that he may have clinically aspirated.  Impression was significant vallecular and piriform sinus pooling which cleared only minimally with subsequent swallows.  As a result of this study, the patient was placed on a dysphagia III diet of nectar-thick liquids and ______ exercises were begun to continue with the patient on an outpatient basis.  He did continue to have improved swallowing function such that the original cause of his dysphagia remained unclear.  By postoperative day #11, September 16, sore throat was also better.   On postoperative day #12, September 17, the patient decided not to proceed with the ______ exercises secondary to improved swallowing.  He still had a sore throat which was treated with Magic Mouth Wash.  As mentioned above, his Lopressor was held secondary to bronchospastic lung disease.  He completed a course of Cipro which was discontinued for gram-positive cocci and chains.  He was ambulating independently.  He was taking oral nourishment.  His swallow has gradually improved on postoperative days #11, #12, and #13.  He still continues to have episodes of panic attacks.  His mental status is clear.  His incision is healing well.  He seems to need continued reassurance that his recovery is on a fairly controlled and progressive course.  DISCHARGE MEDICATIONS:  He goes home on the following medications.  These have been changed slightly.  1. Ultram 50 mg 1 to 2 tablets p.o. q.4-6h. p.r.n. pain.  2. Coumadin 5 mg daily or as directed by physician.  3. Ferrous sulfate 325 mg twice daily with food.  4. Folic acid 1 mg daily.  5. Pepcid 20 mg twice daily.  6. Advair Diskus 250/50 one puff twice daily.  7. Robitussin AC 1 teaspoon every 4 hours for cough and to help get     secretions up.  8. Ativan as needed for anxiety.  9. Rhinocort spray as needed. 10. Cardura will be restarted after more convalescence.  ACTIVITY:  Ambulation as tolerated.  He is asked not to lift  more than 10 pounds nor to drive in the next six weeks.  DISCHARGE DIET:  Low-sodium, low-cholesterol diet.  WOUND CARE:  He may shower daily, keeping his incision clean and dry.  FOLLOWUP:  He will have home health to visit daily.  They will draw blood to check his Coumadin levels on Friday, September 21, with the results to Dr. Amil Amen.  He will also see Dr. Amil Amen two weeks after his discharge.  He is asked to call [phone] to arrange the appointment.  A chest x-ray will be taken at that time.  He also has an  office visit with Dr. Tyrone Sage on Thursday, July 28, 2000, at 10:50 in the morning.  He is asked to bring the chest x-ray. DD:  07/12/00 TD:  07/14/00 Job: [id number]

## 2011-07-29 ENCOUNTER — Other Ambulatory Visit (HOSPITAL_COMMUNITY): Payer: Self-pay | Admitting: Neurology

## 2011-07-29 DIAGNOSIS — G20 Parkinson's disease: Secondary | ICD-10-CM

## 2011-08-04 ENCOUNTER — Ambulatory Visit (HOSPITAL_COMMUNITY)
Admission: RE | Admit: 2011-08-04 | Discharge: 2011-08-04 | Disposition: A | Discharge status: Home / Self Care | Payer: Medicare Other | Source: Ambulatory Visit | Attending: Neurology | Admitting: Neurology

## 2011-08-04 DIAGNOSIS — G20 Parkinson's disease: Secondary | ICD-10-CM

## 2011-08-04 DIAGNOSIS — R471 Dysarthria and anarthria: Secondary | ICD-10-CM | POA: Insufficient documentation

## 2011-08-04 DIAGNOSIS — R1310 Dysphagia, unspecified: Secondary | ICD-10-CM | POA: Insufficient documentation

## 2011-08-04 DIAGNOSIS — G20A1 Parkinson's disease without dyskinesia, without mention of fluctuations: Secondary | ICD-10-CM | POA: Insufficient documentation

## 2011-11-18 ENCOUNTER — Other Ambulatory Visit: Payer: Self-pay | Admitting: Orthopaedic Surgery

## 2011-11-18 DIAGNOSIS — M79652 Pain in left thigh: Secondary | ICD-10-CM

## 2011-11-20 ENCOUNTER — Inpatient Hospital Stay: Admission: RE | Admit: 2011-11-20 | Payer: Medicare Other | Source: Ambulatory Visit

## 2011-11-25 ENCOUNTER — Ambulatory Visit
Admission: RE | Admit: 2011-11-25 | Discharge: 2011-11-25 | Disposition: A | Discharge status: Home / Self Care | Payer: Medicare Other | Source: Ambulatory Visit | Attending: Orthopaedic Surgery | Admitting: Orthopaedic Surgery

## 2011-11-25 DIAGNOSIS — M79652 Pain in left thigh: Secondary | ICD-10-CM

## 2012-06-22 ENCOUNTER — Ambulatory Visit (HOSPITAL_COMMUNITY): Payer: Medicare Other

## 2012-06-23 ENCOUNTER — Ambulatory Visit (HOSPITAL_COMMUNITY)
Admission: RE | Admit: 2012-06-23 | Discharge: 2012-06-23 | Disposition: A | Discharge status: Home / Self Care | Payer: Medicare Other | Source: Ambulatory Visit | Attending: Neurology | Admitting: Neurology

## 2012-06-23 DIAGNOSIS — G20A1 Parkinson's disease without dyskinesia, without mention of fluctuations: Secondary | ICD-10-CM

## 2012-06-23 DIAGNOSIS — G20 Parkinson's disease: Secondary | ICD-10-CM

## 2012-06-23 DIAGNOSIS — R1310 Dysphagia, unspecified: Secondary | ICD-10-CM

## 2012-06-23 DIAGNOSIS — R1313 Dysphagia, pharyngeal phase: Secondary | ICD-10-CM | POA: Insufficient documentation

## 2012-06-23 NOTE — Procedures (Addendum)
Objective Swallowing Evaluation: Modified Barium Swallowing Study  Patient Details  Name: [name] [name] MRN: [id number] Date of Birth: Mar 12, 1941  Today's Date: 06/23/2012 Time: 1200-1230 SLP Time Calculation (min): 30 min  Past Medical History: No past medical history on file. Past Surgical History: No past surgical history on file. HPI:  71 year old male referred for OP MBS secondary to recent c/o coughing with meals.  Pt had MBS on 08/04/11 which showed mild motor based oropharyngeal dysphagia with trace residuals that may also be due to possible (though not observed) esophageal dysphagia. Pt recommended to continue a regular diet and thin liquids and undergo a course of OP SLP therapy for pharyngeal strengthening given dx of Parkinsons.  PMH of Parkinson's disease, spinal stenosis, detached retina, asthma, mitral valve disorder. restless leg syndrome, OCD, anxiety, memoryloss, hyperreflexia.     Assessment / Plan / Recommendation Clinical Impression  Dysphagia Diagnosis: Mild pharyngeal phase dysphagia;Suspected primary esophageal dysphagia Clinical impression: Pt presents with a mild motor based pharyngeal dysphagia with weak base of tongue leading to vallecular residuals. Sensory deficits also present as pt has not sensation of moderate pharyngeal residuals. Cues required to elicit multiple swallows to clear. Suspected esophageal dysphagia may also be contributing factor to pharyngeal residual as decresed negative pharyngeal pressures can sometimes be a result of dysmotility (if present. No radiologist present to confirm). No aspiration or penetration occured during study. Recommend continuing current diet with double swallows, esophageal precautions and f/u with outpatient SLP for pharyngeal strengthening exercises.  MD may wish to consider esophageal assessment/GI referral for possible GER.     Treatment Recommendation       Diet Recommendation Regular;Thin liquid   Liquid  Administration via: Cup;Straw Medication Administration: Whole meds with liquid Supervision: Patient able to self feed Compensations: Small sips/bites;Multiple dry swallows after each bite/sip;Follow solids with liquid;Effortful swallow Postural Changes and/or Swallow Maneuvers: Seated upright 90 degrees;Upright 30-60 min after meal    Other  Recommendations Recommended Consults: Consider GI evaluation;Consider esophageal assessment   Follow Up Recommendations  Outpatient SLP    Frequency and Duration        Pertinent Vitals/Pain NA    SLP Swallow Goals     General HPI: 71 year old male referred for OP MBS secondary to recent c/o coughing with meals.  Pt had MBS on 08/04/11 which showed mild motor based oropharyngeal dysphagia with trace residuals that may also be due to possible (though not observed) esophageal dysphagia. Pt recommended to continue a regular diet and thin liquids and undergo a course of OP SLP therapy for pharyngeal strengthening given dx of Parkinsons.  PMH of Parkinson's disease, spinal stenosis, detached retina, asthma, mitral valve disorder. restless leg syndrome, OCD, anxiety, memoryloss, hyperreflexia. Type of Study: Modified Barium Swallowing Study Reason for Referral: Objectively evaluate swallowing function Previous Swallow Assessment: see HPI Diet Prior to this Study: Regular;Thin liquids Temperature Spikes Noted: N/A Respiratory Status: Room air History of Recent Intubation: No Behavior/Cognition: Alert;Cooperative;Pleasant mood Oral Cavity - Dentition: Adequate natural dentition Oral Motor / Sensory Function: Within functional limits Self-Feeding Abilities: Able to feed self Patient Positioning: Upright in chair Baseline Vocal Quality: Clear Volitional Cough: Strong Volitional Swallow: Able to elicit Anatomy: Within functional limits Pharyngeal Secretions: Not observed secondary MBS    Reason for Referral Objectively evaluate swallowing function     Oral Phase     Pharyngeal Phase Pharyngeal Phase: Impaired   Cervical Esophageal Phase    GO   Functional Assessment Tool Used Functional Limitations Swallowing  Swallow Current Status [phone]) At least 20 percent but less than 40 percent impaired, limited or restricted Swallow Goal Status (H8469) Swallow Discharge Status [phone]) At least 20 percent but less than 40 percent impaired, limited or restricted  Cervical Esophageal Phase: Impaired   Harlon Ditty, MA CCC-SLP [phone] [place]kel Kurek, Riley Nearing 06/23/2012, 1:49 PM

## 2012-08-18 ENCOUNTER — Ambulatory Visit: Payer: Medicare Other | Attending: Neurology

## 2012-08-18 DIAGNOSIS — R1313 Dysphagia, pharyngeal phase: Secondary | ICD-10-CM | POA: Insufficient documentation

## 2012-08-18 DIAGNOSIS — R41841 Cognitive communication deficit: Secondary | ICD-10-CM | POA: Insufficient documentation

## 2012-08-18 DIAGNOSIS — IMO0001 Reserved for inherently not codable concepts without codable children: Secondary | ICD-10-CM | POA: Insufficient documentation

## 2012-09-06 ENCOUNTER — Ambulatory Visit: Payer: Medicare Other | Attending: Neurology

## 2012-09-06 DIAGNOSIS — R1313 Dysphagia, pharyngeal phase: Secondary | ICD-10-CM | POA: Insufficient documentation

## 2012-09-06 DIAGNOSIS — IMO0001 Reserved for inherently not codable concepts without codable children: Secondary | ICD-10-CM | POA: Insufficient documentation

## 2012-09-06 DIAGNOSIS — R41841 Cognitive communication deficit: Secondary | ICD-10-CM | POA: Insufficient documentation

## 2012-09-18 DIAGNOSIS — I951 Orthostatic hypotension: Secondary | ICD-10-CM | POA: Insufficient documentation

## 2012-09-29 ENCOUNTER — Ambulatory Visit: Payer: Medicare Other | Attending: Neurology

## 2012-09-29 DIAGNOSIS — R1313 Dysphagia, pharyngeal phase: Secondary | ICD-10-CM | POA: Insufficient documentation

## 2012-09-29 DIAGNOSIS — IMO0001 Reserved for inherently not codable concepts without codable children: Secondary | ICD-10-CM | POA: Insufficient documentation

## 2012-09-29 DIAGNOSIS — R41841 Cognitive communication deficit: Secondary | ICD-10-CM | POA: Insufficient documentation

## 2012-10-06 ENCOUNTER — Ambulatory Visit: Payer: Medicare Other

## 2012-11-14 ENCOUNTER — Other Ambulatory Visit: Payer: Self-pay | Admitting: Gastroenterology

## 2012-11-14 DIAGNOSIS — R1310 Dysphagia, unspecified: Secondary | ICD-10-CM

## 2012-11-20 ENCOUNTER — Ambulatory Visit
Admission: RE | Admit: 2012-11-20 | Discharge: 2012-11-20 | Disposition: A | Discharge status: Home / Self Care | Payer: Medicare Other | Source: Ambulatory Visit | Attending: Gastroenterology | Admitting: Gastroenterology

## 2012-11-20 DIAGNOSIS — R1310 Dysphagia, unspecified: Secondary | ICD-10-CM

## 2012-12-16 ENCOUNTER — Encounter: Payer: Self-pay | Admitting: Neurology

## 2012-12-16 DIAGNOSIS — H3320 Serous retinal detachment, unspecified eye: Secondary | ICD-10-CM | POA: Insufficient documentation

## 2012-12-16 DIAGNOSIS — E559 Vitamin D deficiency, unspecified: Secondary | ICD-10-CM | POA: Insufficient documentation

## 2012-12-16 DIAGNOSIS — M4802 Spinal stenosis, cervical region: Secondary | ICD-10-CM | POA: Insufficient documentation

## 2012-12-16 DIAGNOSIS — G20A1 Parkinson's disease without dyskinesia, without mention of fluctuations: Secondary | ICD-10-CM | POA: Insufficient documentation

## 2012-12-16 DIAGNOSIS — F429 Obsessive-compulsive disorder, unspecified: Secondary | ICD-10-CM | POA: Insufficient documentation

## 2012-12-16 DIAGNOSIS — J45909 Unspecified asthma, uncomplicated: Secondary | ICD-10-CM | POA: Insufficient documentation

## 2012-12-16 DIAGNOSIS — I059 Rheumatic mitral valve disease, unspecified: Secondary | ICD-10-CM | POA: Insufficient documentation

## 2012-12-16 DIAGNOSIS — R259 Unspecified abnormal involuntary movements: Secondary | ICD-10-CM | POA: Insufficient documentation

## 2012-12-16 DIAGNOSIS — F3289 Other specified depressive episodes: Secondary | ICD-10-CM | POA: Insufficient documentation

## 2012-12-16 DIAGNOSIS — G2581 Restless legs syndrome: Secondary | ICD-10-CM | POA: Insufficient documentation

## 2013-01-11 ENCOUNTER — Encounter: Payer: Self-pay | Admitting: Neurology

## 2013-01-12 ENCOUNTER — Encounter: Payer: Self-pay | Admitting: Neurology

## 2013-01-18 ENCOUNTER — Telehealth: Payer: Self-pay | Admitting: Neurology

## 2013-01-18 NOTE — Telephone Encounter (Signed)
Wife called and is concerned that pt has had 3 days of being "off".  This being 3 times this week, usually at sundown, around 1930 (freezing, not focusing, stares).  Taking comptan qid with the sinemet 25/100 ( 2tabs 0800, 1/1/2 tabs at 1100, 2 tabs at 1500, and 1/ 1/2 tabs at 1900).  Confirmed this with wife.  She has given these doses for awhile (January, maybe before).  Consulted Dr. Frances Furbish and she stated the comptan can cause these sx and would like to decrease dose to BID (given at 0800 and 1500 only).  No other changes at this time.  Would expect improvement within 1-2 wks.  Wife will keep [place] posted.  Appt now scheduled in 04/2013.

## 2013-01-23 ENCOUNTER — Telehealth: Payer: Self-pay | Admitting: Neurology

## 2013-01-23 NOTE — Telephone Encounter (Signed)
After speaking with Dr. Frances Furbish.  Comptan will be given when pt takes his sinemet 1/1/2 tabs at 1100 and 1900.  Xanax prn.  She said pt states this does not help.  He has not been taking this.  I told her if pt seems anxious he can take this prn.  She verbalized understanding of med change.  He took 0800 dose of comptan this am, and I relayed for pt to get on regimen of taking at 1100 and 1900 (so he will take three doses today).

## 2013-01-23 NOTE — Telephone Encounter (Signed)
I called and spoke with wife and made the appt for 0830 on Monday.  Pt continues with spell.  Dr. Vickey Huger called and recommended to give the comptan 0800, 1100 and 1500 and skip the 1900.  Wife is concerned about having 2 different recomendations.  Please advise.   Would like to know prior to next dose if possible.,

## 2013-01-23 NOTE — Telephone Encounter (Signed)
Dr. Vickey Huger had spoken with wife and husband continues with spells even with the decrease of comptan.  Needs [place] appt.  Sent to [place], [place] for appt

## 2013-01-29 ENCOUNTER — Encounter: Payer: Self-pay | Admitting: Neurology

## 2013-01-29 ENCOUNTER — Ambulatory Visit (INDEPENDENT_AMBULATORY_CARE_PROVIDER_SITE_OTHER): Payer: Medicare Other | Admitting: Neurology

## 2013-01-29 VITALS — BP 113/65 | HR 58 | Temp 96.2°F | Ht 68.5 in | Wt 155.0 lb

## 2013-01-29 DIAGNOSIS — R413 Other amnesia: Secondary | ICD-10-CM

## 2013-01-29 DIAGNOSIS — F341 Dysthymic disorder: Secondary | ICD-10-CM

## 2013-01-29 DIAGNOSIS — G20A1 Parkinson's disease without dyskinesia, without mention of fluctuations: Secondary | ICD-10-CM

## 2013-01-29 DIAGNOSIS — G4752 REM sleep behavior disorder: Secondary | ICD-10-CM

## 2013-01-29 DIAGNOSIS — G2581 Restless legs syndrome: Secondary | ICD-10-CM

## 2013-01-29 DIAGNOSIS — G20 Parkinson's disease: Secondary | ICD-10-CM

## 2013-01-29 MED ORDER — ENTACAPONE 200 MG PO TABS: 200.0000 mg | ORAL_TABLET | Freq: Two times a day (BID) | ORAL | Status: DC

## 2013-01-29 MED ORDER — CARBIDOPA-LEVODOPA 25-100 MG PO TABS: 2.0000 | ORAL_TABLET | Freq: Four times a day (QID) | ORAL | Status: DC

## 2013-01-29 NOTE — Progress Notes (Signed)
Subjective:    Patient ID: [name] [name] is a 72 y.o. male.  HPI  Interim history:  [name] [name] is pleasant 72 year old right-handed gentleman who presents for followup consultation of his Parkinson's disease. This is his first visit with me and he is accompanied by his wife today. He previously followed with Dr. Sandria Manly for approximately 10 years. He has an underlying medical history of heart disease, asthma, hyperlipidemia, depression, and scoliosis as well as detached retina and was last seen by Dr. Sandria Manly on 01/01/2013, at which time Dr. Sandria Manly look for etiologies of his confusion. Dr. Sandria Manly felt it may be a side effect from gabapentin seen and also checked blood work as well as a urinalysis. I reviewed those test results. The CBC from 01/01/2013 was unremarkable, CMP was unremarkable, urinalysis was negative. He requested that the patient's wife administer his medications. The patient was supposed to followup in 4 months, that is, in July but they've requested a sooner appointment with me because of ongoing issues with his cognitive function and confusion, as well as increased tremulousness.  His medications are: Gabapentin 300 mg each bedtime, Xanax 0.25 mg when necessary, tramadol as needed, oxybutynin daily, calcium 500 mg daily, ropinirole 2 mg each bedtime, Comtan 200 mg twice daily, sertraline 100 mg 2 pills daily, baby aspirin once daily, clonazepam 0.5 mg 1-1/2 pills each night, Sinemet 25/100 mg strength 2 tablets at 8 AM, 1-1/2 tablets at 11 AM, 2 tablets at 3 PM, and 1-1/2 pills at 7 PM, simvastatin 20 mg once daily, Travatan eyedrops, vitamin D once daily. His wife has called several times recently and I had suggested reducing his Comtan to twice daily from 4 times a day as well as utilizing the Xanax for anxiety spells. He has been having swallowing problems, including coughing with pills, liquids and solids and he had a esophagogram on 11/20/12, which showed severe dysmotility, and disruption of all  primary waves and extensive tertiary movements. He feels that the Comtan does not really make a difference and he has off time mostly in the evenings, but the past 2-3 days have been fairly good. He sleeps well and has good bladder control. His constipation fluctuates. He wears compression stockings for OH, which Dr. Sandria Manly. He uses a stationary bike 3 days/week. He is limited in his activity d/t L hip pain. He had left all the burners of the stove on recently. He had left the oven on recently.   I reviewed Dr. Imagene Gurney prior notes and the patient's records and below is a summary of that review:  72 year old RH WM with PD, diagnosed on 09/15/2003. He stopped working on 09/13/2005. He has memory problems, depression, dysarthria, dysphagia, postural hypotension, mild motor fluctuations, voiding dysfunction, but no bowel or bladder incontinence or hallucinations. He had side effects from Aricept, Exelon, and Galantamine. He exercised on a recumbent bike 5 days per week for 20-25 minutes. Since he started Sinemet in 10/2009, he has improvement in his motor skills, according to his wife, but had an increase in "restless leg" symptoms, and felt a "high" after taking the Sinemet, and was noted to be slightly manic, and less inhibited. He is on oxybutynin instead of Toviaz per his urologist and on sertraline 100 mg twice a day per Dr. Nolen Mu, who manages his depression. MRI of the c-spine on 10/08/2009 requested by Dr.Brashear showed mild spinal stenosis C5-6 with mild right and severe left foraminal stenosis at that level, severe biforaminal stenosis at C4-5,and left foraminal stenosis at C6-7.  He has less appetite. He has left hip "bursitis" and 3 epidural steroid injections for left upper thigh pain by Dr. Alvester Morin. He had physical therapy. He underwent acupuncture with [facility] in his pain. Dr. Nolen Mu placed him on gabapentin 300 milligrams twice a day with significant relief of his pain. He has nocturia x1. He no  longer drives. He is independent in his ADLs and has exhibited compulsive behavior with desire to gamble, eat sweets, chew gum and increased sexual behavior with DAs He sleeps well and takes clonazepam 0.5 mg 1-1/2 at night for RBD and occasionally takes it for anxiety. He has to get up and walk from restless leg symptoms. In 05/2011, he was started on Comtan 100 mg q.i.d. and he had significant improvement. He has "off" Sx approximately one hour per week. Postural hypotension was noted and he was prescribed high compression hose. He has had "collapses" but does not blackout. He underwent [employer] [facility] Status testing at 17/30 by Dr. Nolen Mu. Dr. Nolen Mu prescribed 0.25 mg  Xanax when necessary for anxiety. He is having dysphagia with recent modified barium swallow and underwent a GI evaluation for esophageal dysphagia. He recently moved into assisted living at [facility] for 3 days while his wife rested. He fell on 01/01/13.    His Past Medical History Is Significant For: Past Medical History  Diagnosis Date  . Scoliosis   . Heart disease   . Hypertrophy (benign) of prostate   . Asthma   . Hypercholesteremia   . Depression     His Past Surgical History Is Significant For: Past Surgical History  Procedure Laterality Date  . Refractive surgery Bilateral     2 on OD, 1 on OS  . Mitral valve repair N/A     His Family History Is Significant For: History reviewed. No pertinent family history.  His Social History Is Significant For: History   Social History  . Marital Status: Married    Spouse Name: N/A    Number of Children: N/A  . Years of Education: N/A   Social History Main Topics  . Smoking status: Never Smoker   . Smokeless tobacco: None  . Alcohol Use: None  . Drug Use: No  . Sexually Active: None   Other Topics Concern  . None   Social History Narrative  . None    His Allergies Are:  Allergies  Allergen Reactions  . Codeine   :   His Current  Medications Are:  Outpatient Encounter Prescriptions as of 01/29/2013  Medication Sig Dispense Refill  . ALPRAZolam (XANAX) 0.25 MG tablet Take 0.25 mg by mouth at bedtime as needed for sleep.      Marland Kitchen aspirin 81 MG tablet Take 81 mg by mouth daily.      . calcium-vitamin D (OSCAL WITH D) 500-200 MG-UNIT per tablet Take 1 tablet by mouth daily.      . carbidopa-levodopa (SINEMET IR) 25-100 MG per tablet Take 2 tablets by mouth 4 (four) times daily.  720 tablet  3  . clonazePAM (KLONOPIN) 0.5 MG tablet Take 0.5 mg by mouth 2 (two) times daily as needed for anxiety.      . entacapone (COMTAN) 200 MG tablet Take 1 tablet (200 mg total) by mouth 2 (two) times daily. 11:00 and & 7:00 PM  180 tablet  3  . fluocinonide (LIDEX) 0.05 % external solution       . gabapentin (NEURONTIN) 300 MG capsule       .  latanoprost (XALATAN) 0.005 % ophthalmic solution       . minocycline (MINOCIN,DYNACIN) 100 MG capsule       . oxybutynin (DITROPAN) 5 MG tablet Take 5 mg by mouth 3 (three) times daily.      Marland Kitchen rOPINIRole (REQUIP) 2 MG tablet Take 2 mg by mouth at bedtime.      . sertraline (ZOLOFT) 100 MG tablet Take 100 mg by mouth daily.      . simvastatin (ZOCOR) 20 MG tablet Take 20 mg by mouth every evening.      . traMADol (ULTRAM) 50 MG tablet Take 50 mg by mouth every 6 (six) hours as needed for pain.      Marland Kitchen travoprost, benzalkonium, (TRAVATAN) 0.004 % ophthalmic solution Place 1 drop into both eyes every other day.      . [DISCONTINUED] carbidopa-levodopa (SINEMET IR) 25-100 MG per tablet Take 1 tablet by mouth 3 (three) times daily.      . [DISCONTINUED] entacapone (COMTAN) 200 MG tablet Take 200 mg by mouth 2 (two) times daily. 11:00 and & 7:00 PM       No facility-administered encounter medications on file as of 01/29/2013.    Review of Systems  HENT: Positive for trouble swallowing.        Hearing loss  Gastrointestinal:       Difficulty swallowing  Neurological:       Memory loss, restless leg   Psychiatric/Behavioral: Positive for confusion and dysphoric mood. The patient is nervous/anxious.        Decreased energy    Objective:  Neurologic Exam  Physical Exam Physical Examination:   Filed Vitals:   01/29/13 0850  BP: 113/65  Pulse: 58  Temp: 96.2 F (35.7 C)    General Examination: The patient is a very pleasant 72 y.o. male in no acute distress.  HEENT: Normocephalic, atraumatic, pupils are equal, round and reactive to light and accommodation. Extraocular tracking shows mild saccadic breakdown without nystagmus noted. There is mild limitation to upper gaze. There is mild decrease in eye blink rate. Hearing is impaired with hearing aid in L ear. Face is symmetric with moderate facial masking and normal facial sensation. There is no lip, neck or jaw tremor. Neck is moderately rigid with intact passive ROM. There are no carotid bruits on auscultation. Oropharynx exam reveals mild mouth dryness. No significant airway crowding is noted. Mallampati is class II. Tongue protrudes centrally and palate elevates symmetrically.    Chest: is clear to auscultation without wheezing, rhonchi or crackles noted.  Heart: sounds are regular and normal without murmurs, rubs or gallops noted.   Abdomen: is soft, non-tender and non-distended with normal bowel sounds appreciated on auscultation.  Extremities: There is no pitting edema in the distal lower extremities bilaterally. Pedal pulses are intact.  Skin: is warm and dry with no trophic changes noted.  Musculoskeletal: exam reveals no obvious joint deformities, tenderness or joint swelling or erythema.  Neurologically:  Mental status: The patient is awake and alert, paying good  attention. He is able to to partially provide the history. His wife provides details. He is oriented to: person, place, situation, day of week, month of year and year. His memory, attention, language and knowledge are fair. There is no aphasia, agnosia, apraxia  or anomia. There is a mild degree of bradyphrenia. Speech is mildly hypophonic with minimal dysarthria noted. Mood is slightly depressed and anxious and affect is blunted.  Fall Assessment Tool Score is 15.   Cranial  nerves are as described above under HEENT exam. In addition, shoulder shrug is normal with equal shoulder height noted.  Motor exam: Normal bulk, and strength for age is noted. Tone is mildly rigid with presence of cogwheeling in the right upper extremity. There is overall mild bradykinesia. There is no drift or rebound. There is no tremor.  Romberg is negative. Reflexes are 2+ in the upper extremities and 2+ in the lower extremities, but trace in the ankles. Fine motor skills exam reveals: Finger taps are mildly to moderately impaired on the right and mildly impaired on the left. Hand movements are mildly impaired on the right and mildly impaired on the left. RAP (rapid alternating patting) is mildly impaired on the right and mildly impaired on the left. Foot taps are mildly to moderately impaired on the right and mildly impaired on the left. Foot agility (in the form of heel stomping) is mildly impaired on the right and mildly impaired on the left.    Cerebellar testing shows no dysmetria or intention tremor on finger to nose testing. Heel to shin is unremarkable bilaterally. There is no truncal or gait ataxia.   Sensory exam is intact to light touch, pinprick, vibration, temperature sense and proprioception in the upper and lower extremities.   Gait, station and balance exan: He stands up from the seated position with minimal difficulty and needs no assistance. No veering to one side is noted. He is not noted to lean to the side. Posture is mildly stooped. Stance is wide-based. He walks with decrease in stride length but good pace and decreased arm swing on the right and left. He turns in 3 steps. Tandem walk is not possible. Balance is mildly impaired. He is not able to do a toe or heel  stance.     Assessment and Plan:    Assessment and Plan:  In summary, [name] [name] is a very pleasant 72 y.o.-year old male with a history of Parkinson's disease, of over 10 years duration. His physical exam is stable, when compared to Dr. Imagene Gurney note. I do believe anxiety is a big component in his case any does have a history of premorbid history of anxiety and depression. I had a long chat with the patient and his wife about my findings and the diagnosis of PD, the prognosis and treatment options. We talked about medical treatments and non-pharmacological approaches. We talked about maintaining a healthy lifestyle in general. I encouraged the patient to eat healthy, exercise daily and keep well hydrated, to keep a scheduled bedtime and wake time routine, to not skip any meals and eat healthy snacks in between meals and to have protein with every meal.   As far as further diagnostic testing is concerned, I suggested no further testing today.  As far as medications are concerned, I recommended the following at this time: carbidopa-levodopa 2 pills 4 times a day namely at 8:00, 11 AM, 3 PM and 7 PM. Comtan one pill at 11 AM and 3 PM. If needed, he can take an additional half pill of Sinemet in the evening when he plans to go out or when he feels he is out and feeling off. For example, he can take half a pill at 8 or 8:30 PM. I recommended that utilizing Xanax as needed for anxiety or hyperactivity. Down the road we may consider a very small dose of dopamine agonist, in the form of pramipexole but I do not want to make too many changes too fast.  I answered all their questions today and the patient and his wife were in agreement with the above outlined plan. I would like to see the patient back in 4 months, sooner if the need arises and encouraged them to call with any interim questions, concerns, problems or updates and refill requests. I renewed his Sinemet and Comtan prescription today for 90  days.

## 2013-01-29 NOTE — Patient Instructions (Addendum)
I think overall you are doing fairly well but I do want to suggest a few things today:   Remember to drink plenty of fluid, eat healthy meals and do not skip any meals. Try to eat protein with a every meal and eat a healthy snack such as fruit or nuts in between meals. Try to keep a regular sleep-wake schedule and try to exercise daily, particularly in the form of walking, 20-30 minutes a day, if you can.   Engage in social activities in your community and with your family and try to keep up with current events by reading the newspaper or watching the news.   As far as your medications are concerned, I would like to suggest the following: I would like for you to take carbidopa-levodopa 2 pills 4 times a day namely at 8:00, 11 AM, 3 PM and 7 PM. Please take Comtan one pill at 11 AM and 3 PM. If needed you can take an additional half pill of Sinemet in the evening when you plan to go out or when you're out and feel off. You can take half a pill at 8 or 8:30 PM. Use Xanax as needed for anxiety or hyperactivity. Down the road we may consider a very small dose of dopamine agonist, in the form of neuropathic is but I do not want to make too many changes too fast.   I would like to see you back in 4 months, sooner if we need to. Please call us with any interim questions, concerns, problems, updates or refill requests.  Brett Canales is my clinical assistant and will answer any of your questions and relay your messages to me and also relay most of my messages to you.  Our phone number is [phone]. We also have an after hours call service for urgent matters and there is a physician on-call for urgent questions. For any emergencies you know to call 911 or go to the nearest emergency room.

## 2013-03-07 ENCOUNTER — Telehealth: Payer: Self-pay | Admitting: Neurology

## 2013-03-07 NOTE — Telephone Encounter (Signed)
Please advise patient's wife that he may take an additional half pill of Sinemet when he has excessive or early of time after the 11:00 dose, for example, he could take half a pill of Sinemet at 12:30 PM.

## 2013-03-07 NOTE — Telephone Encounter (Signed)
Pts wife calling and pt is having a off time (walking and skills wise), no freezing about 1 hour after 1100.  Also dementia worsening.  Went over sinemet and comptan regimen per last visit note and there is no change.   Please advise.

## 2013-03-08 NOTE — Telephone Encounter (Signed)
I called pts wife and let her know of the above instructions.  She verbalized understanding.   No address of worsening dementia.

## 2013-03-30 ENCOUNTER — Telehealth: Payer: Self-pay | Admitting: Neurology

## 2013-03-30 ENCOUNTER — Telehealth: Payer: Self-pay | Admitting: *Deleted

## 2013-03-30 NOTE — Telephone Encounter (Signed)
Had a very long conversation with the patient's wife regarding his condition trying to explain to her that not all day-to-day fluctuations represents a progression of the underlying Parkinson's disease and not all of symptoms reflect issues with underlying Parkinson's disease. The reason why I had suggested her evaluation primary care physician is to look at the global picture make sure he's not infected or has any electrolyte disturbance or is dehydrated and so forth. I have suggested that he use his cane at all times and keep well hydrated. He is already on a complicated medication regimen and I would feel reluctant to increase anything at this moment even if his Parkinson's had indeed progressed. I explained to her that supportive care is critical at this time and making sure he does not fall and stay well-hydrated. If there is a sudden decline in functioning I would still recommend evaluation by his primary care physician. She demonstrated understanding and agreement.

## 2013-03-30 NOTE — Telephone Encounter (Signed)
I called and left a message for the patient that per Dr. Frances Furbish he needs to go see his pcp and there will be fluctation day to day with confusion and balance. Dr. Frances Furbish reviewed patient's last visit and phone message.

## 2013-03-30 NOTE — Telephone Encounter (Signed)
Patient's spouse called stating she would like to speak with physician concerning his balance and why her husband needs to see a pcp.

## 2013-04-20 ENCOUNTER — Other Ambulatory Visit (HOSPITAL_COMMUNITY): Payer: Self-pay | Admitting: Neurology

## 2013-04-21 NOTE — Telephone Encounter (Signed)
Former [facility] patient assigned to Dr Athar.  

## 2013-05-04 ENCOUNTER — Encounter: Payer: Self-pay | Admitting: Neurology

## 2013-05-08 ENCOUNTER — Encounter: Payer: Self-pay | Admitting: Neurology

## 2013-05-08 ENCOUNTER — Ambulatory Visit (INDEPENDENT_AMBULATORY_CARE_PROVIDER_SITE_OTHER): Payer: Medicare Other | Admitting: Neurology

## 2013-05-08 VITALS — BP 134/78 | HR 60 | Temp 98.5°F | Ht 68.5 in | Wt 155.0 lb

## 2013-05-08 DIAGNOSIS — F429 Obsessive-compulsive disorder, unspecified: Secondary | ICD-10-CM

## 2013-05-08 DIAGNOSIS — F411 Generalized anxiety disorder: Secondary | ICD-10-CM

## 2013-05-08 DIAGNOSIS — F329 Major depressive disorder, single episode, unspecified: Secondary | ICD-10-CM

## 2013-05-08 DIAGNOSIS — G20 Parkinson's disease: Secondary | ICD-10-CM

## 2013-05-08 NOTE — Patient Instructions (Addendum)
Remember to drink plenty of fluid, eat healthy meals and do not skip any meals. Try to eat protein with a every meal and eat a healthy snack such as fruit or nuts in between meals. Try to keep a regular sleep-wake schedule and try to exercise daily, particularly in the form of walking, 20-30 minutes a day, if you can. Use your stationary bike. Use your cane consistently. Use Xanax as needed.   As far as your medications are concerned, I would like to suggest coming off of the Comtan by taking one pill once daily for a three days, then stop.   As far as diagnostic testing: no new test.   I would like to see you back in 3 months, sooner if we need to. Please call us with any interim questions, concerns, problems, updates or refill requests.  Please also call us for any test results so we can go over those with you on the phone. Brett Canales is my clinical assistant and will answer any of your questions and relay your messages to me and also relay most of my messages to you.  Our phone number is [phone]. We also have an after hours call service for urgent matters and there is a physician on-call for urgent questions. For any emergencies you know to call 911 or go to the nearest emergency room.

## 2013-05-08 NOTE — Progress Notes (Signed)
Subjective:    Patient ID: Russell Quinney is a 72 y.o. male.  HPI  Interim history:   Mr. Starace is a very pleasant 72 year old gentleman, who presents for followup consultation of his advanced Parkinson's disease. He is accompanied by his wife again today. He is a former patient of Dr. Avie Echevaria. He has an underlying medical history of heart disease, asthma, hyperlipidemia, depression, scoliosis, detached retina. He's had intermittent confusion. He's had swallowing problems. I first met him on 01/29/2013. His PD was diagnosed in 2004. This is associated with memory problems, depression, dysarthria, dysphagia, postural hypotension, bladder dysfunction. He's had physical therapy. At the time of his first visit with me I felt that his physical exam was stable. He had quite significant anxiety. He was alternating Sinemet 2 pills and 1-1/2 pills and I suggested going to 2 pills 4 times a day at the time. He's had compulsive behavior with dopamine agonists in the past. He has a history of RBD. He was prescribed compression hose is due to postural hypotension. I reduced his comtan to one pill twice daily.  He quit driving maybe 2 years ago. He has occasional hallucinations, but not bothersome. He got samples for Exelon patch, but has not started it yet. In the past, he had SE with Aricept, Exelon and Galantamine, mostly in the form of nausea. He has not been on Exelon patch before. His wife is particularly worried about his cognitive decline. He wonders, if the Comtan is helping him at this time. He feels very sleep at 11 AM almost daily and takes a nap for at least an hour daily, after which he feels better. This happens 3-4 days of the week.   He takes C/L 2 pills qid. He takes gabapentin 300 mg at 11 AM and 3 PM. He takes Zoloft 100 mg bid at 9 AM and 3 PM. He becomes "hyper", mostly around 6 PM. His wife has other concerns regarding his cognitive abilities. He has become lost, he gets lost in the store, he  has left all 4 burners on of the stove. His balance is not as good. He is not exercising as much.   His Past Medical History Is Significant For: Past Medical History  Diagnosis Date  . Scoliosis   . Heart disease   . Hypertrophy (benign) of prostate   . Asthma   . Hypercholesteremia   . Depression     His Past Surgical History Is Significant For: Past Surgical History  Procedure Laterality Date  . Refractive surgery Bilateral     2 on OD, 1 on OS  . Mitral valve repair N/A     His Family History Is Significant For: History reviewed. No pertinent family history.  His Social History Is Significant For: History   Social History  . Marital Status: Married    Spouse Name: N/A    Number of Children: N/A  . Years of Education: N/A   Social History Main Topics  . Smoking status: Never Smoker   . Smokeless tobacco: None  . Alcohol Use: None  . Drug Use: No  . Sexually Active: None   Other Topics Concern  . None   Social History Narrative  . None    His Allergies Are:  Allergies  Allergen Reactions  . Aricept (Donepezil Hcl) Other (See Comments)    Confusion  . Codeine   :   His Current Medications Are:  Outpatient Encounter Prescriptions as of 05/08/2013  Medication Sig Dispense Refill  .  ALPRAZolam (XANAX) 0.25 MG tablet Take 0.25 mg by mouth at bedtime as needed for sleep.      Marland Kitchen aspirin 81 MG tablet Take 81 mg by mouth daily.      . calcium-vitamin D (OSCAL WITH D) 500-200 MG-UNIT per tablet Take 1 tablet by mouth daily.      . carbidopa-levodopa (SINEMET IR) 25-100 MG per tablet Take 2 tablets by mouth 4 (four) times daily.  720 tablet  3  . clonazePAM (KLONOPIN) 0.5 MG tablet Take 0.5 mg by mouth 2 (two) times daily as needed for anxiety.      . entacapone (COMTAN) 200 MG tablet Take 1 tablet (200 mg total) by mouth 2 (two) times daily. 11:00 and & 7:00 PM  180 tablet  3  . fluocinonide (LIDEX) 0.05 % external solution       . gabapentin (NEURONTIN) 300 MG  capsule       . latanoprost (XALATAN) 0.005 % ophthalmic solution       . minocycline (MINOCIN,DYNACIN) 100 MG capsule       . oxybutynin (DITROPAN) 5 MG tablet Take 5 mg by mouth 3 (three) times daily.      Marland Kitchen rOPINIRole (REQUIP) 2 MG tablet Take 1 tablet (2 mg total) by mouth at bedtime.  90 tablet  1  . sertraline (ZOLOFT) 100 MG tablet Take 100 mg by mouth 2 (two) times daily.       . SF 5000 PLUS 1.1 % CREA dental cream       . simvastatin (ZOCOR) 20 MG tablet Take 20 mg by mouth every evening.      . traMADol (ULTRAM) 50 MG tablet Take 50 mg by mouth every 6 (six) hours as needed for pain.      Marland Kitchen travoprost, benzalkonium, (TRAVATAN) 0.004 % ophthalmic solution Place 1 drop into both eyes every other day.       No facility-administered encounter medications on file as of 05/08/2013.  : Review of Systems  Constitutional: Positive for appetite change and fatigue.  HENT: Positive for hearing loss and trouble swallowing.   Eyes: Positive for visual disturbance (double vision).  Endocrine: Positive for cold intolerance.  Musculoskeletal: Positive for arthralgias.  Skin: Positive for rash.  Neurological: Positive for speech difficulty and weakness.       Memory loss  Psychiatric/Behavioral: Positive for confusion and dysphoric mood. The patient is nervous/anxious.     Objective:  Neurologic Exam  Physical Exam Physical Examination:   Filed Vitals:   05/08/13 1449  BP: 134/78  Pulse: 60  Temp: 98.5 F (36.9 C)    General Examination: The patient is a very pleasant 72 y.o. male in no acute distress.  HEENT: Normocephalic, atraumatic, pupils are equal, round and reactive to light and accommodation. Extraocular tracking shows mild saccadic breakdown without nystagmus noted. There is mild limitation to upper gaze. There is mild decrease in eye blink rate. Hearing is impaired with hearing aid in L ear. Face is symmetric with moderate facial masking and normal facial sensation. There  is no lip, neck or jaw tremor. Neck is moderately rigid with intact passive ROM. There are no carotid bruits on auscultation. Oropharynx exam reveals mild mouth dryness. No significant airway crowding is noted. Mallampati is class II. Tongue protrudes centrally and palate elevates symmetrically.    Chest: is clear to auscultation without wheezing, rhonchi or crackles noted.  Heart: sounds are regular and normal without murmurs, rubs or gallops noted.   Abdomen: is soft,  non-tender and non-distended with normal bowel sounds appreciated on auscultation.  Extremities: There is no pitting edema in the distal lower extremities bilaterally. Pedal pulses are intact.  Skin: is warm and dry with no trophic changes noted.  Musculoskeletal: exam reveals no obvious joint deformities, tenderness or joint swelling or erythema.  Neurologically:  Mental status: The patient is awake and alert, paying good  attention. He is able to to partially provide the history. His wife provides details. He is oriented to: person, place, situation, day of week, month of year and year. His memory, attention, language and knowledge are fair. His MMSE is 27/30, CDT was 4/4 and AFT was 12. There is no aphasia, agnosia, apraxia or anomia. There is a mild degree of bradyphrenia. Speech is mildly hypophonic with minimal dysarthria noted. Mood is slightly depressed and anxious and affect is blunted.  Fall Assessment Tool Score is 15.   Cranial nerves are as described above under HEENT exam. In addition, shoulder shrug is normal with equal shoulder height noted.  Motor exam: Normal bulk, and strength for age is noted. Tone is mildly rigid with presence of cogwheeling in the right upper extremity. There is overall mild bradykinesia. There is no drift or rebound. There is no tremor.  Romberg is negative. Reflexes are 2+ in the upper extremities and 2+ in the lower extremities, but trace in the ankles. Fine motor skills exam reveals:  Finger taps are mildly to moderately impaired on the right and mildly impaired on the left. Hand movements are mildly impaired on the right and mildly impaired on the left. RAP (rapid alternating patting) is mildly impaired on the right and mildly impaired on the left. Foot taps are mildly to moderately impaired on the right and mildly impaired on the left. Foot agility (in the form of heel stomping) is mildly impaired on the right and mildly impaired on the left.    Cerebellar testing shows no dysmetria or intention tremor on finger to nose testing. Heel to shin is unremarkable bilaterally. There is no truncal or gait ataxia.   Sensory exam is intact to light touch, pinprick, vibration, temperature sense and proprioception in the upper and lower extremities.   Gait, station and balance exan: He stands up from the seated position with minimal difficulty and needs no assistance. No veering to one side is noted. He is not noted to lean to the side. Posture is mildly stooped. Stance is wide-based. He walks with decrease in stride length but good pace and decreased arm swing on the right and left. He turns in 3 steps. Tandem walk is not possible. Balance is mildly impaired. He is not able to do a toe or heel stance.    Assessment and Plan:    In summary, Khaza Blansett is a very pleasant 72 y.o.-year old male with a history of Parkinson's disease, of over 10 years duration. While his MMSE today was 27/30, and his physical exam is stable in the last 4 months, I do believe that his psychiatric illness including the OCD and his anxiety and depression are a big component in his case. I had a long chat with the patient and his wife again about his condition and the diagnosis of advanced PD, the prognosis and treatment options. We talked about medical treatments and non-pharmacological approaches. We talked about maintaining a healthy lifestyle in general. I encouraged patient to exercise more and use his cane  consistently.  As far as further diagnostic testing is concerned, I suggested  no further testing today.  As far as medications are concerned, I recommended the following at this time: taper off comtan by taking 1 pill once daily for 3 days, then stop. His wife is encouraged to give him the Xanax 0.25 mg once daily as needed. His Sinemet will stay the same for now.  We talked about his driving and I strongly discouraged him from driving. Most of our 50 minute visit was spent in counseling and coordination of care. His wife has major concerns with his cognitive abilities and worries about his OCD. She feels that he obsesses a lot and it causes friction between them. She does not feel comfortable leaving him alone as he has left the stove on with a pot of water boiling away. He has also left all burners on one time. She is in counseling because she is going through a lot of stress with his care. I tried to provide as much is advice as possible today. He is advised to continue psychiatric followup. We will not proceed with  I answered a formal cognitive testing again at this moment. He had an evaluation with Dr. Leonides Cave in the past and I will look for those results. I will see him back in about 3-4 months, sooner if the need arises. They did not need any prescription refills today.

## 2013-07-05 ENCOUNTER — Other Ambulatory Visit: Payer: Self-pay | Admitting: Neurology

## 2013-07-06 ENCOUNTER — Telehealth: Payer: Self-pay

## 2013-07-06 NOTE — Telephone Encounter (Signed)
Update: Needs 3 month supply of 1.5 daily at bedtime.

## 2013-07-06 NOTE — Telephone Encounter (Signed)
Rx signed and faxed.

## 2013-07-06 NOTE — Telephone Encounter (Signed)
[name] [name]'s wife called and stated pharmacy has faxed requests for Klonopin 0.5 mg refill for [name] [name]. She states her husband is taking 1.5 tabs at bedtime and as needed and this is very effective. Wife indicate that his last refill was for 130 pills and she is requesting the same.   [name] [name] is out and not sleeping well.

## 2013-07-20 ENCOUNTER — Other Ambulatory Visit (HOSPITAL_COMMUNITY): Payer: Self-pay | Admitting: Neurology

## 2013-08-03 ENCOUNTER — Encounter (INDEPENDENT_AMBULATORY_CARE_PROVIDER_SITE_OTHER): Payer: Self-pay

## 2013-08-03 ENCOUNTER — Ambulatory Visit (INDEPENDENT_AMBULATORY_CARE_PROVIDER_SITE_OTHER): Payer: Medicare Other | Admitting: Neurology

## 2013-08-03 ENCOUNTER — Encounter: Payer: Self-pay | Admitting: Neurology

## 2013-08-03 VITALS — BP 111/64 | HR 61 | Temp 97.5°F | Ht 68.5 in | Wt 160.0 lb

## 2013-08-03 DIAGNOSIS — M79609 Pain in unspecified limb: Secondary | ICD-10-CM

## 2013-08-03 DIAGNOSIS — G2581 Restless legs syndrome: Secondary | ICD-10-CM

## 2013-08-03 DIAGNOSIS — G20A1 Parkinson's disease without dyskinesia, without mention of fluctuations: Secondary | ICD-10-CM

## 2013-08-03 DIAGNOSIS — G20 Parkinson's disease: Secondary | ICD-10-CM

## 2013-08-03 DIAGNOSIS — F329 Major depressive disorder, single episode, unspecified: Secondary | ICD-10-CM

## 2013-08-03 DIAGNOSIS — F429 Obsessive-compulsive disorder, unspecified: Secondary | ICD-10-CM

## 2013-08-03 DIAGNOSIS — R1310 Dysphagia, unspecified: Secondary | ICD-10-CM

## 2013-08-03 DIAGNOSIS — F3289 Other specified depressive episodes: Secondary | ICD-10-CM

## 2013-08-03 DIAGNOSIS — G4752 REM sleep behavior disorder: Secondary | ICD-10-CM

## 2013-08-03 DIAGNOSIS — R413 Other amnesia: Secondary | ICD-10-CM

## 2013-08-03 MED ORDER — MEMANTINE HCL ER 7 MG PO CP24: 7.0000 mg | ORAL_CAPSULE | Freq: Every day | ORAL | Status: DC

## 2013-08-03 NOTE — Progress Notes (Signed)
Subjective:    Patient ID: [name] [name] is a 72 y.o. male.  HPI  Interim history:   Mr. [name] [name] is a very pleasant 72 year old gentleman, who presents for followup consultation of his advanced Parkinson's disease of over 10 years' duration, associated with RBD, hallucinations, memory problems, depression, dysarthria, dysphagia, postural hypotension (was prescribed compression hoses), bladder dysfunction. He is accompanied by his wife again today and presents for a sooner than scheduled appointment because of problems with his memory per wife. I last saw him on 05/08/2013, at which time his MMSE was 27/30, and his physical exam was stable, but I felt that his psychiatric illness including the OCD and his anxiety and depression played a big role in his presentation. I recommended tapering off Comtan by taking 1 pill once daily for 3 days, then stop. His wife was encouraged to give him the Xanax 0.25 mg once daily as needed. He has been on clonazepam. His Sinemet was to stay the same for now.  Per him, he has had worsening in his fine motor skills, he tries to walk with a cane which has helped. He takes 2 pills of Sinemet at 8 AM, 1/2 pill at 10 AM, 2 at 11 AM, 2 at 3 PM and 2 at night. He saw Dr. Marya Fossa recently and was advised to add the 1/2 pill at 10 AM. He had PT in the past. He is taking Requip 1 mg at night for RLS. Neurontin helps with L hip pain, which has been chronic for 4 years. He tried the Exelon patch for 2 days, but felt bad, including nausea.   We talked about his driving and I strongly discouraged him from driving. His wife voiced major concerns with his cognitive abilities and worried about his OCD, and she reported friction between them. She does not feel comfortable leaving him alone as he has left the stove on with a pot of water boiling away. He has also left all burners on one time. She is in counseling because she is going through a lot of stress with being his primary caretaker. He  was advised to continue psychiatric followup. I did not suggest formal cognitive testing again and he had an evaluation with Dr. Leonides Cave in the past.   He previously followed with Dr. Avie Echevaria. He has an underlying medical history of heart disease, asthma, hyperlipidemia, depression, scoliosis, detached retina. He has had intermittent confusion and swallowing problems. I first met him on 01/29/2013. His PD was diagnosed in 2004. He has had physical therapy. At the time of his first visit with me I felt that his physical exam was stable. He had quite significant anxiety. He had compulsive behavior with dopamine agonists in the past. I reduced his comtan to one pill twice daily.  He was given samples for Exelon patch. In the past, he had SE with Aricept, Exelon and Galantamine, mostly in the form of nausea. He had not been on Exelon patch before. His wife was particularly worried about his cognitive decline. He feels very sleepy at 11 AM almost daily and takes a nap for at least an hour daily, after which he feels better. This happens 3-4 days of the week.  He is more "hyper", and more depressed between 6 and 8 PM. His wife has had concerns with his cognitive abilities. He has become lost, he gets lost in the store, he has left all 4 burners on of the stove. His balance is not as good. He is not  exercising as much.  He has had worsening mood swings, more negative at times, not as optimistic as he used to be. He has no sleep problems, no severe constipation. He is eating a lot of sweets.   His Past Medical History Is Significant For: Past Medical History  Diagnosis Date  . Scoliosis   . Heart disease   . Hypertrophy (benign) of prostate   . Asthma   . Hypercholesteremia   . Depression     His Past Surgical History Is Significant For: Past Surgical History  Procedure Laterality Date  . Refractive surgery Bilateral     2 on OD, 1 on OS  . Mitral valve repair N/A     His Family History Is  Significant For: History reviewed. No pertinent family history.  His Social History Is Significant For: History   Social History  . Marital Status: Married    Spouse Name: N/A    Number of Children: N/A  . Years of Education: N/A   Social History Main Topics  . Smoking status: Never Smoker   . Smokeless tobacco: None  . Alcohol Use: None  . Drug Use: No  . Sexual Activity: None   Other Topics Concern  . None   Social History Narrative  . None    His Allergies Are:  Allergies  Allergen Reactions  . Aricept [Donepezil Hcl] Other (See Comments)    Confusion  . Codeine   . Diphenhydramine   :   His Current Medications Are:  Outpatient Encounter Prescriptions as of 08/03/2013  Medication Sig Dispense Refill  . ALPRAZolam (XANAX) 0.25 MG tablet Take 0.25 mg by mouth at bedtime as needed for sleep.      Marland Kitchen aspirin 81 MG tablet Take 81 mg by mouth daily.      . calcium-vitamin D (OSCAL WITH D) 500-200 MG-UNIT per tablet Take 1 tablet by mouth daily.      . carbidopa-levodopa (SINEMET IR) 25-100 MG per tablet Take 2 tablets by mouth 4 (four) times daily.  720 tablet  3  . clobetasol cream (TEMOVATE) 0.05 %       . clonazePAM (KLONOPIN) 0.5 MG tablet TAKE 1 AND 1/2 TABLETS AT BEDTIME  135 tablet  1  . fluocinonide (LIDEX) 0.05 % external solution       . FLUZONE HIGH-DOSE injection       . gabapentin (NEURONTIN) 300 MG capsule       . latanoprost (XALATAN) 0.005 % ophthalmic solution       . minocycline (MINOCIN,DYNACIN) 100 MG capsule       . oxybutynin (DITROPAN) 5 MG tablet Take 5 mg by mouth 3 (three) times daily.      Marland Kitchen PROAIR HFA 108 (90 BASE) MCG/ACT inhaler       . rOPINIRole (REQUIP) 2 MG tablet Take 1 tablet (2 mg total) by mouth at bedtime.  90 tablet  0  . sertraline (ZOLOFT) 100 MG tablet Take 100 mg by mouth 2 (two) times daily.       . SF 5000 PLUS 1.1 % CREA dental cream       . simvastatin (ZOCOR) 20 MG tablet Take 20 mg by mouth every evening.      .  traMADol (ULTRAM) 50 MG tablet Take 50 mg by mouth every 6 (six) hours as needed for pain.      Marland Kitchen travoprost, benzalkonium, (TRAVATAN) 0.004 % ophthalmic solution Place 1 drop into both eyes every other day.      . [  DISCONTINUED] entacapone (COMTAN) 200 MG tablet Take 1 tablet (200 mg total) by mouth 2 (two) times daily. 11:00 and & 7:00 PM  180 tablet  3  . [DISCONTINUED] PATADAY 0.2 % SOLN        No facility-administered encounter medications on file as of 08/03/2013.  : Review of Systems  HENT: Positive for hearing loss, rhinorrhea and trouble swallowing.   Genitourinary: Positive for difficulty urinating.  Musculoskeletal: Positive for arthralgias.  Skin: Positive for rash.  Neurological: Positive for speech difficulty and weakness.       Memory loss, restless leg  Psychiatric/Behavioral: Positive for confusion and dysphoric mood. The patient is nervous/anxious.     Objective:  Neurologic Exam  Physical Exam Physical Examination:   Filed Vitals:   08/03/13 1232  BP: 111/64  Pulse: 61  Temp: 97.5 F (36.4 C)    General Examination: The patient is a very pleasant 72 y.o. male in no acute distress.  HEENT: Normocephalic, atraumatic, pupils are equal, round and reactive to light and accommodation. Extraocular tracking shows mild saccadic breakdown without nystagmus noted. There is mild limitation to upper gaze. There is mild decrease in eye blink rate. Hearing is impaired with hearing aid in L ear. Face is symmetric with moderate facial masking and normal facial sensation. There is no lip, neck or jaw tremor. Neck is moderately rigid with intact passive ROM. There are no carotid bruits on auscultation. Oropharynx exam reveals mild mouth dryness. No significant airway crowding is noted. Mallampati is class II. Tongue protrudes centrally and palate elevates symmetrically.    Chest: is clear to auscultation without wheezing, rhonchi or crackles noted.  Heart: sounds are regular and  normal without murmurs, rubs or gallops noted.   Abdomen: is soft, non-tender and non-distended with normal bowel sounds appreciated on auscultation.  Extremities: There is no pitting edema in the distal lower extremities bilaterally. Pedal pulses are intact.  Skin: is warm and dry with no trophic changes noted.  Musculoskeletal: exam reveals no obvious joint deformities, tenderness or joint swelling or erythema.  Neurologically:  Mental status: The patient is awake and alert, paying good  attention. He is able to to partially provide the history. His wife provides details. He is oriented to: person, place, situation, day of week, month of year and year. His memory, attention, language and knowledge are fair. His MMSE was 27/30, CDT was 4/4 and AFT was 12. There is no aphasia, agnosia, apraxia or anomia. There is a mild degree of bradyphrenia. Speech is mildly hypophonic with minimal dysarthria noted. Mood is slightly depressed and anxious and affect is blunted.   Cranial nerves are as described above under HEENT exam. In addition, shoulder shrug is normal with equal shoulder height noted.  Motor exam: Normal bulk, and strength for age is noted. Tone is mildly rigid with presence of cogwheeling in the right upper extremity. There is overall mild bradykinesia. There is no drift or rebound. There is no tremor.  Romberg is negative. Reflexes are 2+ in the upper extremities and 2+ in the lower extremities, but trace in the ankles. Fine motor skills exam reveals: Finger taps are mildly to moderately impaired on the right and mildly impaired on the left. Hand movements are mildly impaired on the right and mildly impaired on the left. RAP (rapid alternating patting) is mildly impaired on the right and mildly impaired on the left. Foot taps are mildly to moderately impaired on the right and mildly impaired on the left. Foot agility (  in the form of heel stomping) is mildly impaired on the right and mildly  impaired on the left.    Cerebellar testing shows no dysmetria or intention tremor on finger to nose testing. There is no truncal or gait ataxia.   Sensory exam is intact to light touch, pinprick, vibration, temperature sense and proprioception in the upper and lower extremities.   Gait, station and balance exan: He stands up from the seated position with minimal difficulty and needs no assistance. No veering to one side is noted. He is not noted to lean to the side. Posture is mildly stooped. Stance is wide-based. He walks with decrease in stride length but good pace and decreased arm swing on the right and left. He turns in 3 steps. Tandem walk is not possible. Balance is mildly impaired. He is not able to do a toe or heel stance.    Most of my extended, 60 minute visit today with them was spent in counseling and coordination of care.  Assessment and Plan:    In summary, Alberto Pina is a very pleasant 72 year old male with a history of advanced Parkinson's disease, of over 10 years duration, associated with memory loss, OCD, anxiety and depression. His non-motor Sx are a big component in his case. I had a long chat with the patient and his wife again about his condition and the diagnosis of advanced PD, the prognosis and treatment options. We talked about medical treatments and non-pharmacological approaches. We talked about maintaining a healthy lifestyle in general. I encouraged patient to exercise more and use his cane consistently.  As far as further diagnostic testing is concerned, I suggested no further testing today. He had a swallow test in January of this year which showed severe dysmotility with disruption of all primary peristaltic waves and I would like for him to go through speech therapy. I made a referral in that regard. He is overeating sweets and I asked him to stop taking his Requip. He recently stopped his Comtan. I would like for him to continue his Sinemet at the same dose. I  suggested a trial of Namenda for his memory. I gave him a 30 day free prescription for the 7 mg strength for Namenda XR. I asked his wife to call in about 3 weeks for an update, and which time we can increase the dose to 14 mg once daily. In the past she could not tolerate Aricept or galantamine but really only tried Exelon patch recently very briefly for only 2 days. They did not need any prescription refills today. I would like to see him back in 3 months, sooner if the need arises. They were in agreement.

## 2013-08-03 NOTE — Patient Instructions (Addendum)
I think overall you are doing fairly well but I do want to suggest a few things today:  Remember to drink plenty of fluid, eat healthy meals and do not skip any meals. Try to eat protein with a every meal and eat a healthy snack such as fruit or nuts in between meals. Try to keep a regular sleep-wake schedule and try to exercise daily, particularly in the form of walking, 20-30 minutes a day, if you can.   Engage in social activities in your community and with your family and try to keep up with current events by reading the newspaper or watching the news.   As far as your medications are concerned, I would like to suggest a trial of Namenda XR 7 mg once for your memory. Call in 3 weeks to report back and request a Rx for Namenda XR, which will be 14 mg. Please taper off the Requip at night by taking it every other night at 1/2 pill, then stop after one week.  As far as diagnostic testing: no test. We will do speech therapy. Use your cane at all times.   Reduce your sweets intake.   I would like to see you back in 3 months, sooner if we need to. Please call us with any interim questions, concerns, problems, updates or refill requests.  Please also call us for any test results so we can go over those with you on the phone. Brett Canales is my clinical assistant and will answer any of your questions and relay your messages to me and also relay most of my messages to you.  Our phone number is [phone]. We also have an after hours call service for urgent matters and there is a physician on-call for urgent questions. For any emergencies you know to call 911 or go to the nearest emergency room.

## 2013-08-08 ENCOUNTER — Other Ambulatory Visit: Payer: Self-pay | Admitting: Neurology

## 2013-08-08 DIAGNOSIS — G20 Parkinson's disease: Secondary | ICD-10-CM

## 2013-08-09 ENCOUNTER — Other Ambulatory Visit (HOSPITAL_COMMUNITY): Payer: Self-pay | Admitting: Neurology

## 2013-08-09 DIAGNOSIS — R1310 Dysphagia, unspecified: Secondary | ICD-10-CM

## 2013-08-10 ENCOUNTER — Ambulatory Visit: Payer: Medicare Other

## 2013-08-15 ENCOUNTER — Ambulatory Visit (HOSPITAL_COMMUNITY)
Admission: RE | Admit: 2013-08-15 | Discharge: 2013-08-15 | Disposition: A | Discharge status: Home / Self Care | Payer: Medicare Other | Source: Ambulatory Visit | Attending: Neurology | Admitting: Neurology

## 2013-08-15 DIAGNOSIS — R1313 Dysphagia, pharyngeal phase: Secondary | ICD-10-CM | POA: Insufficient documentation

## 2013-08-15 DIAGNOSIS — G20A1 Parkinson's disease without dyskinesia, without mention of fluctuations: Secondary | ICD-10-CM

## 2013-08-15 DIAGNOSIS — G20 Parkinson's disease: Secondary | ICD-10-CM

## 2013-08-15 DIAGNOSIS — R1310 Dysphagia, unspecified: Secondary | ICD-10-CM | POA: Insufficient documentation

## 2013-08-15 NOTE — Procedures (Signed)
Objective Swallowing Evaluation: Modified Barium Swallowing Study  Patient Details  Name: [name] [name] MRN: [id number] Date of Birth: 04-07-1941  Today's Date: 08/15/2013 Time: 1105-1210 SLP Time Calculation (min): 65 min  Past Medical History:  Past Medical History  Diagnosis Date  . Scoliosis   . Heart disease   . Hypertrophy (benign) of prostate   . Asthma   . Hypercholesteremia   . Depression    Past Surgical History:  Past Surgical History  Procedure Laterality Date  . Refractive surgery Bilateral     2 on OD, 1 on OS  . Mitral valve repair N/A    HPI:  72 yr old seen for outpatient MBS accompanied by wife with history of Parkinson's disease, hiatal hernia, BPH , detached retina, HTN, depression, fluctuating severity of cognitive deficits.   MBS 06/23/12 revealed pharyngeal residuals without aspiration and recommended regular texture diet and thin liquids.  Barium esophagram 10/2012 showed severe dysmotility with disruption of all primary peristaltic waves.  MBS performed in 2012 without results located.  Pt. reports coughing during and after po's but increased coughing following meals and difficulty swallowing pills.  He denies pna.     Assessment / Plan / Recommendation Clinical Impression  Dysphagia Diagnosis: Suspected primary esophageal dysphagia;Mild pharyngeal phase dysphagia Clinical impression: Results were similar to MBS performed 05/2012.  Mild motor based pharyngeal dysphagia exhibited with reduced tongue base retraction leading to mild-moderate vallecular residuals and decreased laryngeal elevation resulting in mild-mod pyriform sinus residue.  A second swallow assists in reducing amount of residue.  Trace laryngeal flash penetration observed which spontaneously exited laryngeal vestibule during the swallow.  One instance, trace penetration on lower portion of epiglottis that was removed with a cue to cough.  MBS does not diagnose esophageal impairments below the level  of the UES, however esophagus was briefly scanned revealing pill to halt mid esophagus.  Additional trials thin barium and applesauce were not successful in transiting pill which remained at the GE junction.  Observed upward movement of po's through the esophagus.  Coughing at end of meals may be due to irritation of esophageal residue resulting in a reflux cough. Risk of aspiration is moderate primarily from esophageal stasis.  Recommend pt. continue regular diet (using caution with dry, crumbly foods) and thin liquids, second dry swallow, throat clear/cough 3 times intermittently throughout the meal and general esophageal precautions.        Treatment Recommendation  Defer treatment plan to SLP at (Comment)    Diet Recommendation Regular;Thin liquid   Liquid Administration via: Cup;No straw Medication Administration: Whole meds with puree Supervision: Patient able to self feed;Intermittent supervision to cue for compensatory strategies Compensations: Slow rate;Small sips/bites;Multiple dry swallows after each bite/sip;Clear throat intermittently Postural Changes and/or Swallow Maneuvers: Seated upright 90 degrees;Upright 30-60 min after meal    Other  Recommendations Oral Care Recommendations: Oral care BID   Follow Up Recommendations  Outpatient SLP    Frequency and Duration        Pertinent Vitals/Pain none            Reason for Referral Objectively evaluate swallowing function   Oral Phase Oral Preparation/Oral Phase Oral Phase: WFL   Pharyngeal Phase Pharyngeal Phase Pharyngeal Phase: Impaired Pharyngeal - Nectar Pharyngeal - Nectar Teaspoon: Pharyngeal residue - valleculae;Pharyngeal residue - pyriform sinuses;Reduced tongue base retraction;Reduced laryngeal elevation Pharyngeal - Nectar Cup: Pharyngeal residue - valleculae;Pharyngeal residue - pyriform sinuses;Reduced tongue base retraction;Reduced laryngeal elevation Pharyngeal - Thin Pharyngeal - Thin Teaspoon:  Pharyngeal  residue - valleculae;Pharyngeal residue - pyriform sinuses;Reduced tongue base retraction;Reduced laryngeal elevation Pharyngeal - Thin Cup: Pharyngeal residue - valleculae;Pharyngeal residue - pyriform sinuses;Reduced tongue base retraction;Reduced laryngeal elevation;Penetration/Aspiration during swallow Penetration/Aspiration details (thin cup): Material enters airway, remains ABOVE vocal cords then ejected out;Material enters airway, remains ABOVE vocal cords and not ejected out Pharyngeal - Solids Pharyngeal - Regular: Pharyngeal residue - valleculae;Reduced laryngeal elevation Pharyngeal - Pill: Within functional limits  Cervical Esophageal Phase    GO    Cervical Esophageal Phase Cervical Esophageal Phase: [facility]         [employer] [phone]  08/15/2013

## 2013-08-16 ENCOUNTER — Ambulatory Visit: Payer: Medicare Other | Admitting: Neurology

## 2013-08-16 NOTE — Progress Notes (Signed)
Late Entry- SLP addendum   08/15/13 1600  SLP G-Codes **NOT FOR INPATIENT CLASS**  Functional Assessment Tool Used clinical judgement  Functional Limitations Swallowing  Swallow Current Status (W0981) CJ  Swallow Goal Status (X9147) CJ  Swallow Discharge Status (W2956) CJ  SLP Evaluations  $ SLP Speech Visit 1 Procedure  SLP Evaluations  $Swallowing Treatment 1 Procedure  $MBS Swallow Outpatient 1 Procedure  $Self Care/Home Management 8-22   Breck Coons [place].Minda Meo Pager [phone] '08/16/2013

## 2013-08-17 ENCOUNTER — Ambulatory Visit: Payer: Medicare Other | Attending: Neurology

## 2013-08-17 DIAGNOSIS — IMO0001 Reserved for inherently not codable concepts without codable children: Secondary | ICD-10-CM | POA: Insufficient documentation

## 2013-08-17 DIAGNOSIS — R41841 Cognitive communication deficit: Secondary | ICD-10-CM | POA: Insufficient documentation

## 2013-08-17 DIAGNOSIS — R1313 Dysphagia, pharyngeal phase: Secondary | ICD-10-CM | POA: Insufficient documentation

## 2013-08-21 ENCOUNTER — Ambulatory Visit: Payer: Medicare Other | Admitting: Physical Therapy

## 2013-08-22 ENCOUNTER — Encounter: Payer: Self-pay | Admitting: Podiatrist

## 2013-08-22 ENCOUNTER — Ambulatory Visit (INDEPENDENT_AMBULATORY_CARE_PROVIDER_SITE_OTHER): Payer: Medicare Other | Admitting: Podiatrist

## 2013-08-22 VITALS — BP 145/87 | HR 78 | Resp 12 | Ht 68.0 in | Wt 158.0 lb

## 2013-08-22 DIAGNOSIS — M79609 Pain in unspecified limb: Secondary | ICD-10-CM

## 2013-08-22 DIAGNOSIS — B351 Tinea unguium: Secondary | ICD-10-CM

## 2013-08-22 NOTE — Progress Notes (Signed)
HPI:  Patient presents today for follow up of foot and nail care. Denies any new complaints today. Does continue to have pain left fourth toe. This is chronic in nature  Objective:  Patients chart is reviewed.  Neurovascular status unchanged.  Patients nails are thickened, discolored, distrophic, friable and brittle with yellow-brown discoloration. Patient subjectively relates they are painful with shoes and with ambulation.both feet.  Pain swelling and angular deformity of the left fourth toe is also noted. We again reviewed his x-rays together and it reveals a possible previous fracture at the proximal interphalangeal joint with arthritic changes at the fourth toe noted.  Assessment:  Symptomatic onychomycosis,  arthritis of left fourth toe  Plan:  Discussed treatment options and alternatives.  The symptomatic toenails were debrided through manual an mechanical means without complication. Discussed shoe gear and recommended he be seen at the shoe market to obtain some new shoes.  Also recommended that he always work out with shoes on.   Return appointment recommended at routine intervals of 3 months

## 2013-08-22 NOTE — Patient Instructions (Signed)
Your 4th toe on your left foot may have been broken in the past-- it now looks like there is arthritis all around it.  Your best bet is to but good fitting shoes (shoe market best place to find these shoes)-- you can also try aspercreme on the toe if it becomes too painful.

## 2013-08-23 ENCOUNTER — Ambulatory Visit: Payer: Medicare Other

## 2013-08-23 ENCOUNTER — Ambulatory Visit: Payer: Medicare Other | Admitting: Physical Therapy

## 2013-08-24 ENCOUNTER — Telehealth: Payer: Self-pay | Admitting: *Deleted

## 2013-08-24 NOTE — Telephone Encounter (Signed)
Returned call; spoke with pt's wife: Pt complained of several instances of nocturnal enuresis after taking Namenda for approximately 1 week.  The pt has not taken Namenda for the past 2 days and nocturnal enuresis has stopped.  Per Dr Frances Furbish, I advised Mrs Daughtridge that Mr. Corpus could safely discontinue Namenda w/o concern of side-effects.  I instructed Mrs Lindbloom to call with any further concerns or questions.

## 2013-08-24 NOTE — Telephone Encounter (Signed)
Message copied by Avie Echevaria on Fri Aug 24, 2013  4:52 PM ------      Message from: Seth Bake      Created: Thu Aug 23, 2013  2:51 PM       Mr. Tanimoto stopped by today wanting to speak to you.  He asked that you call him please.  [phone] ------

## 2013-08-27 ENCOUNTER — Ambulatory Visit: Payer: Medicare Other | Attending: Neurology | Admitting: Physical Therapy

## 2013-08-27 DIAGNOSIS — IMO0001 Reserved for inherently not codable concepts without codable children: Secondary | ICD-10-CM | POA: Insufficient documentation

## 2013-08-27 DIAGNOSIS — R1313 Dysphagia, pharyngeal phase: Secondary | ICD-10-CM | POA: Insufficient documentation

## 2013-08-27 DIAGNOSIS — R41841 Cognitive communication deficit: Secondary | ICD-10-CM | POA: Insufficient documentation

## 2013-08-28 ENCOUNTER — Ambulatory Visit: Payer: Medicare Other

## 2013-08-28 ENCOUNTER — Ambulatory Visit: Payer: Medicare Other | Admitting: Physical Therapy

## 2013-08-29 ENCOUNTER — Ambulatory Visit: Payer: Medicare Other | Admitting: Physical Therapy

## 2013-09-04 ENCOUNTER — Ambulatory Visit: Payer: Medicare Other | Admitting: Physical Therapy

## 2013-09-05 ENCOUNTER — Ambulatory Visit: Payer: Medicare Other | Admitting: Physical Therapy

## 2013-09-11 ENCOUNTER — Ambulatory Visit: Payer: Medicare Other | Admitting: Physical Therapy

## 2013-09-17 ENCOUNTER — Ambulatory Visit: Payer: Medicare Other | Admitting: Physical Therapy

## 2013-09-27 ENCOUNTER — Telehealth: Payer: Self-pay

## 2013-09-28 NOTE — Telephone Encounter (Signed)
Duplicate

## 2013-10-01 ENCOUNTER — Telehealth: Payer: Self-pay | Admitting: Neurology

## 2013-10-01 ENCOUNTER — Ambulatory Visit: Payer: Medicare Other | Admitting: Physical Therapy

## 2013-10-01 NOTE — Telephone Encounter (Signed)
Vicky from [facility] called to say that she would like an order for assessment since patient's wife would like to discuss hospice care for patient. Please call Vicky.

## 2013-10-01 NOTE — Telephone Encounter (Signed)
Please advise 

## 2013-10-09 NOTE — Telephone Encounter (Signed)
I called today but it was right at 5:00 p.m. and only got a voice message. I will have to call back between the hours of 8-5

## 2013-10-10 ENCOUNTER — Telehealth: Payer: Self-pay | Admitting: Neurology

## 2013-10-10 NOTE — Telephone Encounter (Signed)
Patient's wife called wanting to schedule an appointment with Dr. Hosie Poisson instead of Dr. Frances Furbish. Please call the patient's wife at the number listed.

## 2013-10-15 NOTE — Telephone Encounter (Signed)
Patient is wishing to change doctors, wants to be scheduled with Dr Hosie Poisson, please advise

## 2013-10-15 NOTE — Telephone Encounter (Signed)
Patient's wife called again, very upset, states that no one ever called her back about getting her an appointment with Dr. Hosie Poisson. Please call the patient.

## 2013-10-17 ENCOUNTER — Telehealth: Payer: Self-pay | Admitting: Neurology

## 2013-10-17 ENCOUNTER — Telehealth: Payer: Self-pay | Admitting: *Deleted

## 2013-10-17 NOTE — Telephone Encounter (Signed)
Spoke to spouse. Requesting to schedule an appt with Dr. Hosie Poisson. Advised there is a process to  switch from Dr. Frances Furbish to Dr. Hosie Poisson. Patient is still in the process.

## 2013-10-17 NOTE — Telephone Encounter (Signed)
Request printed and will be addressed when physicians return from holidays.

## 2013-10-22 ENCOUNTER — Telehealth: Payer: Self-pay | Admitting: Neurology

## 2013-10-22 NOTE — Telephone Encounter (Signed)
Wife calling back about an appt that she feels should be set up with Dr. Hosie Poisson. I advised her that there has not been an answer entered into the system as of yet but it is being worked on by the nurse. She states that she wants a call back today. I advised her I would put in her request.

## 2013-10-22 NOTE — Telephone Encounter (Signed)
I called and spoke with patient's fife. She is quite overwhelmed and is looking forward to her husband seeing Dr. Hosie Poisson. She feels his symptoms are worsening and perhaps an increase in his medications may be helpful.   I spoke with Dr. Frances Furbish on Friday and Dr. Hosie Poisson today. Patient will be transferred to Dr. Hosie Poisson. OV scheduled for 11/05/2013 at 9:30 a.m.

## 2013-10-22 NOTE — Telephone Encounter (Signed)
error 

## 2013-10-30 ENCOUNTER — Other Ambulatory Visit: Payer: Self-pay | Admitting: Neurology

## 2013-11-05 ENCOUNTER — Encounter: Payer: Self-pay | Admitting: Neurology

## 2013-11-05 ENCOUNTER — Ambulatory Visit (INDEPENDENT_AMBULATORY_CARE_PROVIDER_SITE_OTHER): Payer: Medicare Other | Admitting: Neurology

## 2013-11-05 ENCOUNTER — Encounter (INDEPENDENT_AMBULATORY_CARE_PROVIDER_SITE_OTHER): Payer: Self-pay

## 2013-11-05 VITALS — BP 99/70 | HR 67 | Ht 67.0 in | Wt 156.0 lb

## 2013-11-05 DIAGNOSIS — G20 Parkinson's disease: Secondary | ICD-10-CM

## 2013-11-05 DIAGNOSIS — G2589 Other specified extrapyramidal and movement disorders: Secondary | ICD-10-CM

## 2013-11-05 DIAGNOSIS — G248 Other dystonia: Secondary | ICD-10-CM

## 2013-11-05 DIAGNOSIS — I951 Orthostatic hypotension: Secondary | ICD-10-CM

## 2013-11-05 DIAGNOSIS — G241 Genetic torsion dystonia: Secondary | ICD-10-CM

## 2013-11-05 NOTE — Progress Notes (Signed)
Subjective:    Patient ID: [name] [name] is a 73 y.o. male with history of PD presenting for follow up visit. Last visit was 07/2013 with Dr. Mallory Shirk which time he was tried on Namenda for his PDD. Did not tolerate the medication well, was discontinued. Returns today for follow.   Suffering from depression, occurs most severely in the evening. Typically around 6pm, feels like he has no energy. Notes some pain/muscle aches in his left upper buttocks region, comes and goes. Wife expresses concern that he has been off more frequently then he is on. Having more severe off states. Wife notes that when is is truly "on" he almost gets manic. When off he gets very stiff, doesn't move around that much. Medication can take 30 to 42mnutes, typically gets 1-2 hours of benefit and then goes back off. Notes painful cramping sensation in his left foot, notes sustained flexion of his toes, worse with wearing off of medication. This occurs throughout the day, causing difficulty walking and pain.  Continues to have difficulty with his memory, trouble with short term memory. Stopped taking Namenda due to belief that it caused loss of urinary control. Has had some close calls walking but no falls. Has difficulty with fluctuations in blood pressure. When blood pressure drops he feels unsteady, will have periods of falling slowly to the ground. Uses compression stockings. Hydrates well. No history of CHF or aortic stenosis. Wife notes some increased coughing, has worked with speech therapy briefly. Was told to follow up with GI doctor, told possible abnormality with esophagus but has not followed up.    Prior History from Dr AAlain [name] [name] [name] a very pleasant 73year old gentleman, who presents for followup consultation of his advanced Parkinson's disease of over 10 years' duration, associated with RBD, hallucinations, memory problems, depression, dysarthria, dysphagia, postural hypotension (was prescribed compression  hoses), bladder dysfunction. He is accompanied by his wife again today and presents for a sooner than scheduled appointment because of problems with his memory per wife. I last saw him on 05/08/2013, at which time his MMSE was 27/30, and his physical exam was stable, but I felt that his psychiatric illness including the OCD and his anxiety and depression played a big role in his presentation. I recommended tapering off Comtan by taking 1 pill once daily for 3 days, then stop. His wife was encouraged to give him the Xanax 0.25 mg once daily as needed. He has been on clonazepam. His Sinemet was to stay the same for now.  Per him, he has had worsening in his fine motor skills, he tries to walk with a cane which has helped. He takes 2 pills of Sinemet at 8 AM, 1/2 pill at 10 AM, 2 at 11 AM, 2 at 3 PM and 1/2 pill at 5pm, 2pills 7pm. . He saw Dr. BMaudry Mayhewrecently and was advised to add the 1/2 pill at 10 AM. He had PT in the past. He is taking Requip 1 mg at night for RLS. Neurontin helps with L hip pain, which has been chronic for 4 years. He tried the Exelon patch for 2 days, but felt bad, including nausea.   We talked about his driving and I strongly discouraged him from driving. His wife voiced major concerns with his cognitive abilities and worried about his OCD, and she reported friction between them. She does not feel comfortable leaving him alone as he has left the stove on with a pot of water boiling away. He has also  left all burners on one time. She is in counseling because she is going through a lot of stress with being his primary caretaker. He was advised to continue psychiatric followup. I did not suggest formal cognitive testing again and he had an evaluation with Dr. Valentina Shaggy in the past.   He previously followed with Dr. Morene Antu. He has an underlying medical history of heart disease, asthma, hyperlipidemia, depression, scoliosis, detached retina. He has had intermittent confusion and swallowing  problems. I first met him on 01/29/2013. His PD was diagnosed in 2004. He has had physical therapy. At the time of his first visit with me I felt that his physical exam was stable. He had quite significant anxiety. He had compulsive behavior with dopamine agonists in the past. I reduced his comtan to one pill twice daily.  He was given samples for Exelon patch. In the past, he had SE with Aricept, Exelon and Galantamine, mostly in the form of nausea. He had not been on Exelon patch before. His wife was particularly worried about his cognitive decline. He feels very sleepy at 11 AM almost daily and takes a nap for at least an hour daily, after which he feels better. This happens 3-4 days of the week.  He is more "hyper", and more depressed between 6 and 8 PM. His wife has had concerns with his cognitive abilities. He has become lost, he gets lost in the store, he has left all 4 burners on of the stove. His balance is not as good. He is not exercising as much.  He has had worsening mood swings, more negative at times, not as optimistic as he used to be. He has no sleep problems, no severe constipation. He is eating a lot of sweets.   His Past Medical History Is Significant For: Past Medical History  Diagnosis Date  . Scoliosis   . Heart disease   . Hypertrophy (benign) of prostate   . Asthma   . Hypercholesteremia   . Depression     His Past Surgical History Is Significant For: Past Surgical History  Procedure Laterality Date  . Refractive surgery Bilateral     2 on OD, 1 on OS  . Mitral valve repair N/A     His Family History Is Significant For: No family history on file.  His Social History Is Significant For: History   Social History  . Marital Status: Married    Spouse Name: Arleen    Number of Children: 1  . Years of Education: college   Social History Main Topics  . Smoking status: Never Smoker   . Smokeless tobacco: None  . Alcohol Use: No  . Drug Use: No  . Sexual  Activity: None   Other Topics Concern  . None   Social History Narrative   Patient lives at home with his wife [name] [name])   Patient is right handed    Education college   Caffeine consumption once daily          His Allergies Are:  Allergies  Allergen Reactions  . Aricept [Donepezil Hcl] Other (See Comments)    Confusion  . Codeine   . Namenda [Memantine Hcl] Other (See Comments)    Urinary incontinence   . Diphenhydramine   :   His Current Medications Are:  Outpatient Encounter Prescriptions as of 11/05/2013  Medication Sig  . ALPRAZolam (XANAX) 0.25 MG tablet Take 0.25 mg by mouth at bedtime as needed for sleep.  Marland Kitchen aspirin 81 MG  tablet Take 81 mg by mouth daily.  . calcium-vitamin D (OSCAL WITH D) 500-200 MG-UNIT per tablet Take 1 tablet by mouth daily.  . carbidopa-levodopa (SINEMET IR) 25-100 MG per tablet TAKE 2 TABLETS BY MOUTH 4 (FOUR) TIMES DAILY.  . clonazePAM (KLONOPIN) 0.5 MG tablet TAKE 1 AND 1/2 TABLETS AT BEDTIME  . gabapentin (NEURONTIN) 300 MG capsule   . latanoprost (XALATAN) 0.005 % ophthalmic solution   . Memantine HCl ER (NAMENDA XR) 7 MG CP24 Take 1 capsule (7 mg total) by mouth daily. To be used with 30 day free trial card.  . minocycline (MINOCIN,DYNACIN) 100 MG capsule   . oxybutynin (DITROPAN) 5 MG tablet Take 5 mg by mouth 3 (three) times daily.  Marland Kitchen PROAIR HFA 108 (90 BASE) MCG/ACT inhaler   . rOPINIRole (REQUIP) 2 MG tablet Take 1 tablet (2 mg total) by mouth at bedtime.  . sertraline (ZOLOFT) 100 MG tablet Take 100 mg by mouth 2 (two) times daily.   . SF 5000 PLUS 1.1 % CREA dental cream   . simvastatin (ZOCOR) 20 MG tablet Take 20 mg by mouth every evening.  . traMADol (ULTRAM) 50 MG tablet Take 50 mg by mouth every 6 (six) hours as needed for pain.  Marland Kitchen travoprost, benzalkonium, (TRAVATAN) 0.004 % ophthalmic solution Place 1 drop into both eyes every other day.  . clobetasol cream (TEMOVATE) 0.05 %   . fluocinonide (LIDEX) 0.05 % external  solution   . FLUZONE HIGH-DOSE injection   . risperiDONE (RISPERDAL) 0.25 MG tablet   : Review of Systems  HENT: Positive for hearing loss, rhinorrhea and trouble swallowing.   Genitourinary: Positive for difficulty urinating.  Musculoskeletal: Positive for arthralgias.  Skin: Positive for rash.  Neurological: Positive for speech difficulty and weakness.       Memory loss, restless leg  Psychiatric/Behavioral: Positive for confusion and dysphoric mood. The patient is nervous/anxious.     Objective:  Neurologic Exam  Physical Exam Physical Examination:   Filed Vitals:   11/05/13 0925  BP: 99/70  Pulse: 67    General Examination: The patient is a very pleasant 73 y.o. male in no acute distress.  HEENT: Normocephalic, atraumatic, pupils are equal, round and reactive to light and accommodation. Extraocular tracking shows mild saccadic breakdown without nystagmus noted. There is mild limitation to upper gaze. There is mild decrease in eye blink rate. Hearing is impaired with hearing aid in L ear. Face is symmetric with moderate facial masking and normal facial sensation. There is no lip, neck or jaw tremor. Neck is moderately rigid with intact passive ROM. There are no carotid bruits on auscultation. Oropharynx exam reveals mild mouth dryness. No significant airway crowding is noted. Mallampati is class II. Tongue protrudes centrally and palate elevates symmetrically.    Chest: is clear to auscultation without wheezing, rhonchi or crackles noted.  Heart: sounds are regular and normal without murmurs, rubs or gallops noted.   Abdomen: is soft, non-tender and non-distended with normal bowel sounds appreciated on auscultation.  Extremities: There is no pitting edema in the distal lower extremities bilaterally. Pedal pulses are intact.  Skin: is warm and dry with no trophic changes noted.  Musculoskeletal: exam reveals no obvious joint deformities, tenderness or joint swelling or  erythema.  Neurologically:  Mental status: The patient is awake and alert, paying good  attention. He is able to to partially provide the history. His wife provides details. He is oriented to: person, place, situation, day of week, month of  year and year. His memory, attention, language and knowledge are fair. His MMSE was 27/30 at last visit, . There is no aphasia, agnosia, apraxia or anomia. There is a mild degree of bradyphrenia. Speech is mildly hypophonic with minimal dysarthria noted. Mood is slightly depressed and anxious and affect is blunted.   Cranial nerves are as described above under HEENT exam. In addition, shoulder shrug is normal with equal shoulder height noted.  Motor exam: Normal bulk, and strength for age is noted. Tone is mildly rigid with presence of cogwheeling in the right upper extremity. There is overall mild bradykinesia. There is no drift or rebound. There is no tremor.  Romberg is negative. Reflexes are 2+ in the upper extremities and 2+ in the lower extremities, but trace in the ankles. Fine motor skills exam reveals: Finger taps are mildly to moderately impaired on the right and mildly impaired on the left. Hand movements are mildly impaired on the right and mildly impaired on the left. RAP (rapid alternating patting) is mildly impaired on the right and mildly impaired on the left. Foot taps are mildly to moderately impaired on the right and mildly impaired on the left. Foot agility (in the form of heel stomping) is mildly impaired on the right and mildly impaired on the left.    Cerebellar testing shows no dysmetria or intention tremor on finger to nose testing. There is no truncal or gait ataxia.   Sensory exam is intact to light touch, pinprick, vibration, temperature sense and proprioception in the upper and lower extremities.   Gait, station and balance exan: He stands up from the seated position with minimal difficulty and needs no assistance. No veering to one side  is noted. He is not noted to lean to the side. Posture is mildly stooped. Stance is wide-based. He walks with decrease in stride length but good pace and decreased arm swing on the right and left. He turns in 3 steps. Tandem walk is not possible. Balance is mildly impaired. He is not able to do a toe or heel stance.    Most of my extended, 60 minute visit today with them was spent in counseling and coordination of care.  Assessment and Plan:    1)PD 2)Orthostatic hypotension 3)Focal dystonia  In summary, [name] [name] is a very pleasant 73 year old male with a history of advanced Parkinson's disease, of over 10 years duration, associated with memory loss, OCD, anxiety and depression. His non-motor Sx are a big component in his case. I had a long chat with the patient and his wife again about his condition and the diagnosis of advanced PD, the prognosis and treatment options. We talked about medical treatments and non-pharmacological approaches. We talked about maintaining a healthy lifestyle in general. I encouraged patient to exercise more and use his cane consistently.   -start Azilect 4m daily -will order Xeomin injections for focal dystonia -can consider addition of florinef in the future for orthostatic hypotension -check MOCA at next visit -discussed upcoming therapeutic options, including Rytari and/or LCIG which may procide additional benefit for him -follow up once Xeomin approval granted

## 2013-11-05 NOTE — Patient Instructions (Signed)
Overall you are doing fairly well but I do want to suggest a few things today:   Remember to drink plenty of fluid, eat healthy meals and do not skip any meals. Try to eat protein with a every meal and eat a healthy snack such as fruit or nuts in between meals. Try to keep a regular sleep-wake schedule and try to exercise daily, particularly in the form of walking, 20-30 minutes a day, if you can.   As far as your medications are concerned, I would like to suggest you continue the Sinemet at its current dose and schedule 1)We will add a medication called Azilect. Please take one 0.5mg  tablet daily for 2 weeks and then increase to 1mg  once daily 2)We will put a request for Xeomin injections,   In the future we will consider options such as Rytari (a slow release oral pill) and Duodopa (Levodopa-Carbidopa Intestinal Gel). Both of these options may be available in the next few months  We will follow up in one month once we get approval for Xeomin injections  My clinical assistant and will answer any of your questions and relay your messages to me and also relay most of my messages to you.   Our phone number is [phone]. We also have an after hours call service for urgent matters and there is a physician on-call for urgent questions. For any emergencies you know to call 911 or go to the nearest emergency room

## 2013-11-07 ENCOUNTER — Encounter: Payer: Self-pay | Admitting: Podiatrist

## 2013-11-07 ENCOUNTER — Ambulatory Visit (INDEPENDENT_AMBULATORY_CARE_PROVIDER_SITE_OTHER): Payer: Medicare Other | Admitting: Podiatrist

## 2013-11-07 VITALS — BP 106/62 | HR 73 | Resp 16

## 2013-11-07 DIAGNOSIS — B351 Tinea unguium: Secondary | ICD-10-CM

## 2013-11-07 DIAGNOSIS — M79609 Pain in unspecified limb: Secondary | ICD-10-CM

## 2013-11-07 NOTE — Progress Notes (Signed)
HPI:  Patient presents today for follow up of foot and nail care. Denies any new complaints today.-- pain in toes from flexion from parkinsons.  Neurologist is going to treat with botox injections when insurance approval is granted.    Objective:  Patients chart is reviewed.  Neurovascular status unchanged.  Patients nails are thickened, discolored, distrophic, friable and brittle with yellow-brown discoloration. Patient subjectively relates they are painful with shoes and with ambulation of bilateral feet. Pain in the toes is present from plantarflexion due to Parkinson's.  Assessment:  Symptomatic onychomycosis, Parkinson's related toe pain  Plan:  Discussed treatment options and alternatives.  The symptomatic toenails were debrided through manual an mechanical means without complication.  Recommended pursuing the Botox injections as recommended by the patient's neurologist. Return appointment recommended at routine intervals of 3 months for routine care.    Trudie Buckler, DPM

## 2013-11-26 ENCOUNTER — Telehealth: Payer: Self-pay | Admitting: Neurology

## 2013-11-26 NOTE — Telephone Encounter (Signed)
Please have them stop the Azilect, push PO fluid intake, change position slowly.

## 2013-11-26 NOTE — Telephone Encounter (Signed)
I called and spoke to wife.   She is very anxious about her husband.  Would like to come in and discuss.   Has stopped azilect and pt is hydrated.   She is requesting an appt with Dr. Janann Colonel, (last seen 11-05-13).  I told her that message had been sent to Dr. Rexene Alberts since she is WID since Dr. Janann Colonel out.   I relayed would send message to Him.  [phone].

## 2013-11-26 NOTE — Telephone Encounter (Signed)
HAS BEEN TAKING AZILECT--STOPPED TAKING--QUESTIONS REGARDING CONDITION AND POSSIBLE HAVING SIDE EFFECTS--PLEASE CALL

## 2013-11-26 NOTE — Telephone Encounter (Signed)
Called patient and spoke with patient's wife Arleen about the patient stopping medication Azilect. Patient's wife stated that the patient was becoming more confused and balance was more off than usual and the patient was having low blood pressure when standing. Last OV was 11/05/13 with Dr. Janann Colonel, when patient was given this medication. Patient's wife would like to know what else could be done at this point. Sending to [facility]. Please advise.

## 2013-11-27 NOTE — Telephone Encounter (Signed)
I called wife and made appt for 1430 tomorrow with Dr. Janann Colonel to evaluate.

## 2013-11-27 NOTE — Telephone Encounter (Signed)
Hi Sandy,  Can you please call and ask her if they want to come in on 2/4 at 3pm or 2/5 at 3pm. If those times don't work, just find a follow up slot to plug them into. Thanks.

## 2013-11-28 ENCOUNTER — Ambulatory Visit (INDEPENDENT_AMBULATORY_CARE_PROVIDER_SITE_OTHER): Payer: Medicare Other | Admitting: Neurology

## 2013-11-28 ENCOUNTER — Encounter: Payer: Self-pay | Admitting: Neurology

## 2013-11-28 VITALS — BP 64/57 | HR 84 | Ht 65.75 in | Wt 151.0 lb

## 2013-11-28 DIAGNOSIS — G20 Parkinson's disease: Secondary | ICD-10-CM

## 2013-11-28 MED ORDER — FLUDROCORTISONE ACETATE 0.1 MG PO TABS: 0.1000 mg | ORAL_TABLET | Freq: Every day | ORAL | Status: DC

## 2013-11-28 NOTE — Progress Notes (Signed)
Subjective:    Patient ID: [name] [name] is a 73 y.o. male with history of PD presenting for follow up visit. Last visit was 11/05/2012 at which time he was started on Azilect 42m daily but wife notes it caused increased confusion, dizziness upon standing. The Azilect was discontinued and he was scheduled for early follow up today.   His main concern is fatigue throughout the day, feels very light headed, especially when standing.He also notes concerns with worsening memory, he especially notes difficulty with short term memory. He may be having some mild hallucinations, described as people in the room who aren't there.  Wife notes his balance is not what it used to be, he forgets to use his cane. Has had a few falls. They inquire about another course of physical therapy.    Prior History from Dr [name] [name] Mr. [name] [name] a very pleasant 73year old gentleman, who presents for followup consultation of his advanced Parkinson's disease of over 10 years' duration, associated with RBD, hallucinations, memory problems, depression, dysarthria, dysphagia, postural hypotension (was prescribed compression hoses), bladder dysfunction. He is accompanied by his wife again today and presents for a sooner than scheduled appointment because of problems with his memory per wife. I last saw him on 05/08/2013, at which time his MMSE was 27/30, and his physical exam was stable, but I felt that his psychiatric illness including the OCD and his anxiety and depression played a big role in his presentation. I recommended tapering off Comtan by taking 1 pill once daily for 3 days, then stop. His wife was encouraged to give him the Xanax 0.25 mg once daily as needed. He has been on clonazepam. His Sinemet was to stay the same for now.  Per him, he has had worsening in his fine motor skills, he tries to walk with a cane which has helped. He takes 2 pills of Sinemet at 8 AM, 1/2 pill at 10 AM, 2 at 11 AM, 2 at 3 PM and 1/2 pill at 5pm,  2pills 7pm. . He saw Dr. BMaudry Mayhewrecently and was advised to add the 1/2 pill at 10 AM. He had PT in the past. He is taking Requip 1 mg at night for RLS. Neurontin helps with L hip pain, which has been chronic for 4 years. He tried the Exelon patch for 2 days, but felt bad, including nausea.   We talked about his driving and I strongly discouraged him from driving. His wife voiced major concerns with his cognitive abilities and worried about his OCD, and she reported friction between them. She does not feel comfortable leaving him alone as he has left the stove on with a pot of water boiling away. He has also left all burners on one time. She is in counseling because she is going through a lot of stress with being his primary caretaker. He was advised to continue psychiatric followup. I did not suggest formal cognitive testing again and he had an evaluation with Dr. ZValentina Shaggyin the past.   He previously followed with Dr. [name] He has an underlying medical history of heart disease, asthma, hyperlipidemia, depression, scoliosis, detached retina. He has had intermittent confusion and swallowing problems. I first met him on 01/29/2013. His PD was diagnosed in 2004. He has had physical therapy. At the time of his first visit with me I felt that his physical exam was stable. He had quite significant anxiety. He had compulsive behavior with dopamine agonists in the past. I reduced his comtan  to one pill twice daily.  He was given samples for Exelon patch. In the past, he had SE with Aricept, Exelon and Galantamine, mostly in the form of nausea. He had not been on Exelon patch before. His wife was particularly worried about his cognitive decline. He feels very sleepy at 11 AM almost daily and takes a nap for at least an hour daily, after which he feels better. This happens 3-4 days of the week.  He is more "hyper", and more depressed between 6 and 8 PM. His wife has had concerns with his cognitive abilities. He  has become lost, he gets lost in the store, he has left all 4 burners on of the stove. His balance is not as good. He is not exercising as much.  He has had worsening mood swings, more negative at times, not as optimistic as he used to be. He has no sleep problems, no severe constipation. He is eating a lot of sweets.   His Past Medical History Is Significant For: Past Medical History  Diagnosis Date  . Scoliosis   . Heart disease   . Hypertrophy (benign) of prostate   . Asthma   . Hypercholesteremia   . Depression     His Past Surgical History Is Significant For: Past Surgical History  Procedure Laterality Date  . Refractive surgery Bilateral     2 on OD, 1 on OS  . Mitral valve repair N/A     His Family History Is Significant For: No family history on file.  His Social History Is Significant For: History   Social History  . Marital Status: Married    Spouse Name: Arleen    Number of Children: 1  . Years of Education: college   Social History Main Topics  . Smoking status: Never Smoker   . Smokeless tobacco: Not on file  . Alcohol Use: No  . Drug Use: No  . Sexual Activity: Not on file   Other Topics Concern  . Not on file   Social History Narrative   Patient lives at home with his wife [name])   Patient is right handed    Education college   Caffeine consumption once daily          His Allergies Are:  Allergies  Allergen Reactions  . Aricept [Donepezil Hcl] Other (See Comments)    Confusion  . Codeine   . Namenda [Memantine Hcl] Other (See Comments)    Urinary incontinence   . Diphenhydramine   :   His Current Medications Are:  Outpatient Encounter Prescriptions as of 11/28/2013  Medication Sig  . ALPRAZolam (XANAX) 0.25 MG tablet Take 0.25 mg by mouth at bedtime as needed for sleep.  Marland Kitchen aspirin 81 MG tablet Take 81 mg by mouth daily.  . calcium-vitamin D (OSCAL WITH D) 500-200 MG-UNIT per tablet Take 1 tablet by mouth daily.  .  carbidopa-levodopa (SINEMET IR) 25-100 MG per tablet TAKE 2 TABLETS BY MOUTH 4 (FOUR) TIMES DAILY.  . chlorhexidine (PERIDEX) 0.12 % solution   . clobetasol cream (TEMOVATE) 0.05 %   . clonazePAM (KLONOPIN) 0.5 MG tablet TAKE 1 AND 1/2 TABLETS AT BEDTIME  . fluocinonide (LIDEX) 0.05 % external solution   . FLUZONE HIGH-DOSE injection   . gabapentin (NEURONTIN) 300 MG capsule   . latanoprost (XALATAN) 0.005 % ophthalmic solution   . Memantine HCl ER (NAMENDA XR) 7 MG CP24 Take 1 capsule (7 mg total) by mouth daily. To be used with 30 day  free trial card.  . minocycline (MINOCIN,DYNACIN) 100 MG capsule   . oxybutynin (DITROPAN) 5 MG tablet Take 5 mg by mouth 3 (three) times daily.  Marland Kitchen PROAIR HFA 108 (90 BASE) MCG/ACT inhaler   . risperiDONE (RISPERDAL) 0.25 MG tablet   . rOPINIRole (REQUIP) 2 MG tablet Take 1 tablet (2 mg total) by mouth at bedtime.  . sertraline (ZOLOFT) 100 MG tablet Take 100 mg by mouth 2 (two) times daily.   . SF 5000 PLUS 1.1 % CREA dental cream   . simvastatin (ZOCOR) 20 MG tablet Take 20 mg by mouth every evening.  . traMADol (ULTRAM) 50 MG tablet Take 50 mg by mouth every 6 (six) hours as needed for pain.  Marland Kitchen travoprost, benzalkonium, (TRAVATAN) 0.004 % ophthalmic solution Place 1 drop into both eyes every other day.  : Review of Systems  HENT: Positive for hearing loss, rhinorrhea and trouble swallowing.   Genitourinary: Positive for difficulty urinating.  Musculoskeletal: Positive for arthralgias.  Skin: Positive for rash.  Neurological: Positive for speech difficulty and weakness.       Memory loss, restless leg  Psychiatric/Behavioral: Positive for confusion and dysphoric mood. The patient is nervous/anxious.     Objective:  Neurologic Exam  Physical Exam Physical Examination:   Filed Vitals:   11/28/13 1446  BP: 64/57  Pulse: 84   Orthostatic BP prone 88/58 P 72(above value is standing)  General Examination: The patient is a very pleasant 73 y.o.  male in no acute distress.  HEENT: Normocephalic, atraumatic, pupils are equal, round and reactive to light and accommodation. Extraocular tracking shows mild saccadic breakdown without nystagmus noted. There is mild limitation to upper gaze. There is mild decrease in eye blink rate. Hearing is impaired with hearing aid in L ear. Face is symmetric with moderate facial masking and normal facial sensation. There is no lip, neck or jaw tremor. Neck is moderately rigid with intact passive ROM. There are no carotid bruits on auscultation. Oropharynx exam reveals mild mouth dryness. No significant airway crowding is noted. Mallampati is class II. Tongue protrudes centrally and palate elevates symmetrically.    Chest: is clear to auscultation without wheezing, rhonchi or crackles noted.  Heart: sounds are regular and normal without murmurs, rubs or gallops noted.   Abdomen: is soft, non-tender and non-distended with normal bowel sounds appreciated on auscultation.  Extremities: There is no pitting edema in the distal lower extremities bilaterally. Pedal pulses are intact.  Skin: is warm and dry with no trophic changes noted.  Musculoskeletal: exam reveals no obvious joint deformities, tenderness or joint swelling or erythema.  Neurologically:  Mental status: The patient is awake and alert, paying good  attention. He is able to to partially provide the history. His wife provides details. He is oriented to: person, place only. His memory, attention, language and knowledge are fair.[place] 14/30, . There is no aphasia, agnosia, apraxia or anomia. There is a mild degree of bradyphrenia. Speech is mildly hypophonic with minimal dysarthria noted. Mood is slightly depressed and anxious and affect is blunted.   Cranial nerves are as described above under HEENT exam. In addition, shoulder shrug is normal with equal shoulder height noted.  Motor exam: Normal bulk, and strength for age is noted. Tone is mildly rigid  with presence of cogwheeling in the right upper extremity. There is overall mild bradykinesia. There is no drift or rebound. There is no tremor.  Romberg is negative. Reflexes are 2+ in the upper extremities and 2+  in the lower extremities, but trace in the ankles. Fine motor skills exam reveals: Finger taps are mildly to moderately impaired on the right and mildly impaired on the left. Hand movements are mildly impaired on the right and mildly impaired on the left. RAP (rapid alternating patting) is mildly impaired on the right and mildly impaired on the left. Foot taps are mildly to moderately impaired on the right and mildly impaired on the left. Foot agility (in the form of heel stomping) is mildly impaired on the right and mildly impaired on the left.    Cerebellar testing shows no dysmetria or intention tremor on finger to nose testing. There is no truncal or gait ataxia.   Sensory exam is intact to light touch, pinprick, vibration, temperature sense and proprioception in the upper and lower extremities.   Gait, station and balance exan: He stands up from the seated position with minimal difficulty and needs no assistance. No veering to one side is noted. He is not noted to lean to the side. Posture is mildly stooped. Stance is wide-based. He walks with decrease in stride length but good pace and decreased arm swing on the right and left. He turns in 3 steps. Tandem walk is not possible. Balance is mildly impaired. He is not able to do a toe or heel stance.    Most of my extended, 60 minute visit today with them was spent in counseling and coordination of care.  Assessment and Plan:    1)PD 2)Orthostatic hypotension 3)Focal dystonia 4)PDD  In summary, Williom Cedar is a very pleasant 73 year old male with a history of advanced Parkinson's disease, of over 10 years duration, associated with memory loss, OCD, anxiety and depression. His non-motor Sx are a big component in his case.Main concern  today is fatigue, gait instability and confusion. Pertinent exam finding is marked orthostatic hypotension, suspect this is strongly contributing to this symptoms. I had a long chat with the patient and his wife again about his condition and the diagnosis of advanced PD, the prognosis and treatment options. We talked about medical treatments and non-pharmacological approaches. We talked about maintaining a healthy lifestyle in general. I encouraged patient to exercise more and use his cane consistently.   -will start Florinef 0.2m in the morning, can titrate up as tolerated. Patient and wife counseled on side effects, instructed to sleep propped up slightly, check BP on regular basis -if no improvement in orthostatic hypotension will consider use of Northera -Xeomin injections for focal dystonia pending -discussed upcoming therapeutic options, including Rytari and/or LCIG which may procide additional benefit for him -follow up once Xeomin approval granted -referral to PT placed  A total of 50 minutes was spent in with this patient. Over half this time was spent on counseling patient on the diagnosis and different therapeutic options available. We extensively discussed his diagnosis, prognosis and course of treatment. All questions were fully answered.

## 2013-11-28 NOTE — Patient Instructions (Addendum)
Overall you are doing fairly well but I do want to suggest a few things today:  As far as your medications are concerned, I would like to suggest the following: 1)Please start Florinef 0.1mg  daily. Please take one tablet daily in the morning. Please track your blood pressure after starting this medication. Please notify our office if your Systolic blood pressure (top number) goes above 140. Please sleep propped up on 1 to 2 pillows at night.   Please use your cane when walking, this will help prevent falls.   I would like to see you back in 3 months, sooner if we need to. Please call us with any interim questions, concerns, problems, updates or refill requests.   Please also call us for any test results so we can go over those with you on the phone.  My clinical assistant and will answer any of your questions and relay your messages to me and also relay most of my messages to you.   Our phone number is [phone]. We also have an after hours call service for urgent matters and there is a physician on-call for urgent questions. For any emergencies you know to call 911 or go to the nearest emergency room

## 2013-12-03 ENCOUNTER — Telehealth: Payer: Self-pay | Admitting: Neurology

## 2013-12-03 NOTE — Telephone Encounter (Signed)
I talked to the patient's wife on call this weekend. She reported that the patient's blood pressure was 174/90s earlier in the day after taking the new medication, Florinef. The blood pressure had come down to 150s over 80s by the time she called me in the evening. I suggested that she does only half a pill a Florinef and only if his sitting blood pressure in the morning is less than 100/60 to avoid any surges in blood pressure. The patient has been shown to be quite sensitive to medications and I explained that it may be better to be more cautious and more conservative dosing the Florinef as well. She demonstrated understanding and agreement.

## 2013-12-03 NOTE — Telephone Encounter (Signed)
Starting taking Florinef .1mg  Friday--needs to discuss blood pressure

## 2013-12-04 ENCOUNTER — Ambulatory Visit: Payer: Medicare Other | Admitting: Neurology

## 2013-12-04 ENCOUNTER — Telehealth: Payer: Self-pay | Admitting: *Deleted

## 2013-12-04 NOTE — Telephone Encounter (Signed)
Spoke with wife and she had spoken with Dr Rexene Alberts on the weekend and she had suggested that patient cut pill in half only if his BP went 100/60 or less in sitting position(See telephone from 12/03/13-Dr Rexene Alberts), wanted to just clarify with Dr Janann Colonel because wife understood to change dosage for patient to 1/2 pill in mornings.  His readings have been 133/90,112/72 for the past couple of days.

## 2013-12-04 NOTE — Telephone Encounter (Signed)
Please let her know he should take 1/2 a tablet of the Florinef in the morning only if his BP was 100/60 or less when in the sitting position. He appears to be very sensitive to the medication and it is best to be cautious with his dosing at this point. Thanks

## 2013-12-04 NOTE — Telephone Encounter (Signed)
Shared Dr Hazle Quant note with patient thru VM message,asked that patient call back if any further questions

## 2013-12-04 NOTE — Telephone Encounter (Signed)
Spoke with wife and tried to explain the previous message from phone note02/09/15 earlier today, she said that she gave patient 1/2 pill this morning, b/p was 133/90, took the pressure this afternoon and it was 169/107-sitting position,informed that he should only get the 1/2 pill if his B/P is 100/60 or less in a sitting position.

## 2013-12-05 NOTE — Telephone Encounter (Signed)
Returned call, message left.

## 2013-12-07 ENCOUNTER — Telehealth: Payer: Self-pay | Admitting: *Deleted

## 2013-12-07 NOTE — Telephone Encounter (Signed)
Patient's wife said that she has been taking his B/P as instructed by Dr Hazle Quant note,she said that his pressure is ok(111/77,120/82), only more tired since taking the one tablet and he still have the flushed face in afternoons but does not have no fever.

## 2013-12-07 NOTE — Telephone Encounter (Signed)
Please let her know that blood pressure is fine. She needs to give it time to see if it will help improve his fatigue. Thanks.

## 2013-12-12 NOTE — Telephone Encounter (Signed)
Called Mrs Litzau back, patient had fall this morning. Did not hit his head, no change in mental status. Counseled her to watch him, if he has a change in mental status then he should be evaluated in the ED. Will continue to take florinef for now. Counseled her on importance of him using a walker to prevent falls, PT referral has already been placed.

## 2013-12-12 NOTE — Telephone Encounter (Signed)
Wife called and said that patient fell this morning, ok but bruised, before he fell, his B/P was 94/65 sitting, after fall it was 87/59, should she be concerned, has not taken his morning B/P pill

## 2013-12-14 ENCOUNTER — Telehealth: Payer: Self-pay | Admitting: *Deleted

## 2013-12-14 DIAGNOSIS — G20 Parkinson's disease: Secondary | ICD-10-CM

## 2013-12-14 NOTE — Telephone Encounter (Signed)
[name] [name] called about pt and looking at managing his care.  She had several questions.  (PT referral, walker prescription, and Florinef med usage).    [facility], pt has appt for 12-31-13 with [facility].  Order for DME walker placed and faxed to [facility], [phone], after consulting Dr. Janann Colonel, instructed that the Florinef 0.1mg  tablet (1/2 tablet to be used only in the AM only if Bp =or < 100/60 (the Bp taken with pt sitting).  Monitor Bp at noon and dinnertime (pt sitting).  The Florinef is to be used only in the AM.  If Bp is > 160/100, needs to call us and let us know.  [name] [name] to fax over the Bp measurements after a week.  If questions to call us back.  She verbalized understanding.  Pt is being followed by SW / also working on getting placement (LTC) for pt.

## 2013-12-14 NOTE — Telephone Encounter (Signed)
Per Dr Janann Colonel, if B/P is below 100/60, take the pill,otherwise do not take the pill, spoke with wife and shared message per physician, she verbalized understanding,will call back if further questions

## 2013-12-18 ENCOUNTER — Ambulatory Visit: Payer: Medicare Other | Admitting: Physical Therapy

## 2013-12-18 ENCOUNTER — Telehealth: Payer: Self-pay | Admitting: Neurology

## 2013-12-18 NOTE — Telephone Encounter (Signed)
Pt's wife called and stated that [name] [name] has gotten light headed twice today and because he is a fall risk, has Parkinson's and Dementia she would like someone to call her and tell her what she needs to do.  She stated that she and the nurse have been watching his blood pressure and at 7 AM it was 146/92 pulse 74 (sitting), then he had a dizzy spell around 2 PM and his BP was 105/73 pulse 79 (sitting) and 121/80 pulse 54 standing.  She is very worried and would like someone to call her back as soon as possible.  Thank you.

## 2013-12-18 NOTE — Telephone Encounter (Signed)
I called and spoke to wife.  She is anxious about pt and the future.  Her being the caregiver for pt primarily, when she has no assistance in the home.  She had aide with her today.  Has appt with [facility] on Friday.  Encouraged her to have pt use the walker,  (have available at all times),  also to have other aides in to help her.  She had someone at her door, so she had to go.

## 2013-12-20 ENCOUNTER — Other Ambulatory Visit: Payer: Self-pay | Admitting: Neurology

## 2013-12-21 ENCOUNTER — Telehealth: Payer: Self-pay | Admitting: *Deleted

## 2013-12-21 NOTE — Telephone Encounter (Signed)
Barnett Applebaum and SW with [facility] out with pt and his wife today.  [facility] rep there talking with wife about 2 wk respite care.  Pts Bp this am 172/99, had one other episode on 12-17-13 of 160/92.  Has not had to give the Florinef since last Monday.  Will fax over last weeks Bp for our records.  Per Barnett Applebaum, pt has increased sundowners, is quietly agitated about respite care discussion.  Future plans LTC . Son in [place].  Will forward message.

## 2013-12-24 ENCOUNTER — Other Ambulatory Visit (HOSPITAL_COMMUNITY): Payer: Self-pay | Admitting: Neurology

## 2013-12-24 NOTE — Telephone Encounter (Signed)
Rx signed and faxed.

## 2013-12-24 NOTE — Telephone Encounter (Signed)
Wife called this am.  Yesterday's Bp at 1330 was 80/52 (sitting).  At 0800 this am 136/86.  He is wearing belt, using walker some.  No falls recently, and has some freezing.  [place] saw pt and wife.  Pcp filling out FL-2 for respite care.   Will call us as needed.  [facility] faxed to [place] update on pt.

## 2013-12-25 ENCOUNTER — Ambulatory Visit: Payer: Medicare Other | Admitting: Physical Therapy

## 2013-12-31 ENCOUNTER — Ambulatory Visit: Payer: Medicare Other | Admitting: Physical Therapy

## 2014-01-16 ENCOUNTER — Ambulatory Visit: Payer: Medicare Other | Attending: Neurology | Admitting: Physical Therapy

## 2014-01-16 DIAGNOSIS — R269 Unspecified abnormalities of gait and mobility: Secondary | ICD-10-CM | POA: Insufficient documentation

## 2014-01-16 DIAGNOSIS — IMO0001 Reserved for inherently not codable concepts without codable children: Secondary | ICD-10-CM | POA: Insufficient documentation

## 2014-01-17 ENCOUNTER — Emergency Department (HOSPITAL_COMMUNITY)
Admission: EM | Admit: 2014-01-17 | Discharge: 2014-01-17 | Disposition: A | Discharge status: Home / Self Care | Payer: Medicare Other | Source: Home / Self Care | Attending: Family Medicine | Admitting: Family Medicine

## 2014-01-17 ENCOUNTER — Encounter (HOSPITAL_COMMUNITY): Payer: Self-pay | Admitting: Emergency Medicine

## 2014-01-17 ENCOUNTER — Emergency Department (INDEPENDENT_AMBULATORY_CARE_PROVIDER_SITE_OTHER): Payer: Medicare Other

## 2014-01-17 DIAGNOSIS — S20219A Contusion of unspecified front wall of thorax, initial encounter: Secondary | ICD-10-CM

## 2014-01-17 NOTE — Discharge Instructions (Signed)
Thank you for coming in today. Use ibuprofen as needed.  Take deep breaths frequently.  Follow up with your doctor soon.  Call or go to the emergency room if you get worse, have trouble breathing, have chest pains, or palpitations.   Chest Contusion A chest contusion is a deep bruise on your chest area. Contusions are the result of an injury that caused bleeding under the skin. A chest contusion may involve bruising of the skin, muscles, or ribs. The contusion may turn blue, purple, or yellow. Minor injuries will give you a painless contusion, but more severe contusions may stay painful and swollen for a few weeks. CAUSES  A contusion is usually caused by a blow, trauma, or direct force to an area of the body. SYMPTOMS   Swelling and redness of the injured area.  Discoloration of the injured area.  Tenderness and soreness of the injured area.  Pain. DIAGNOSIS  The diagnosis can be made by taking a history and performing a physical exam. An X-ray, CT scan, or MRI may be needed to determine if there were any associated injuries, such as broken bones (fractures) or internal injuries. TREATMENT  Often, the best treatment for a chest contusion is resting, icing, and applying cold compresses to the injured area. Deep breathing exercises may be recommended to reduce the risk of pneumonia. Over-the-counter medicines may also be recommended for pain control. HOME CARE INSTRUCTIONS   Put ice on the injured area.  Put ice in a plastic bag.  Place a towel between your skin and the bag.  Leave the ice on for 15-20 minutes, 03-04 times a day.  Only take over-the-counter or prescription medicines as directed by your caregiver. Your caregiver may recommend avoiding anti-inflammatory medicines (aspirin, ibuprofen, and naproxen) for 48 hours because these medicines may increase bruising.  Rest the injured area.  Perform deep-breathing exercises as directed by your caregiver.  Stop smoking if you  smoke.  Do not lift objects over 5 pounds (2.3 kg) for 3 days or longer if recommended by your caregiver. SEEK IMMEDIATE MEDICAL CARE IF:   You have increased bruising or swelling.  You have pain that is getting worse.  You have difficulty breathing.  You have dizziness, weakness, or fainting.  You have blood in your urine or stool.  You cough up or vomit blood.  Your swelling or pain is not relieved with medicines. MAKE SURE YOU:   Understand these instructions.  Will watch your condition.  Will get help right away if you are not doing well or get worse. Document Released: 07/06/2001 Document Revised: 07/05/2012 Document Reviewed: 04/03/2012 [facility] Patient Information 2014 [place].

## 2014-01-17 NOTE — ED Notes (Signed)
C/o falling in the bathroom around 6:15 pm   States he hit his abd area on the commode  States he did not lose consciousness

## 2014-01-17 NOTE — ED Provider Notes (Signed)
[name] [name] is a 73 y.o. male who presents to Urgent Care today for followup. Patient had a mechanical fall against the commode this afternoon. He landed on his right abdomen. He initially had the wind knocked out of him. He denies any loss of consciousness. He notes mild soreness in his right low  lateral false ribs. He denies any trouble breathing or severe pain. No nausea vomiting or diarrhea.   Past Medical History  Diagnosis Date  . Scoliosis   . Heart disease   . Hypertrophy (benign) of prostate   . Asthma   . Hypercholesteremia   . Depression    History  Substance Use Topics  . Smoking status: Never Smoker   . Smokeless tobacco: Not on file  . Alcohol Use: No   ROS as above Medications: No current facility-administered medications for this encounter.   Current Outpatient Prescriptions  Medication Sig Dispense Refill  . ALPRAZolam (XANAX) 0.25 MG tablet Take 0.25 mg by mouth at bedtime as needed for sleep.      Marland Kitchen aspirin 81 MG tablet Take 81 mg by mouth daily.      . calcium-vitamin D (OSCAL WITH D) 500-200 MG-UNIT per tablet Take 1 tablet by mouth daily.      . carbidopa-levodopa (SINEMET IR) 25-100 MG per tablet TAKE 2 TABLETS BY MOUTH 4 (FOUR) TIMES DAILY.  720 tablet  0  . chlorhexidine (PERIDEX) 0.12 % solution       . clobetasol cream (TEMOVATE) 0.05 %       . clonazePAM (KLONOPIN) 0.5 MG tablet TAKE 1 & 1/2 TABLETS BY MOUTH AT BEDTIME  135 tablet  1  . fludrocortisone (FLORINEF) 0.1 MG tablet Take 1 tablet (0.1 mg total) by mouth daily.  90 tablet  6  . fluocinonide (LIDEX) 0.05 % external solution       . FLUZONE HIGH-DOSE injection       . gabapentin (NEURONTIN) 300 MG capsule       . latanoprost (XALATAN) 0.005 % ophthalmic solution       . Memantine HCl ER (NAMENDA XR) 7 MG CP24 Take 1 capsule (7 mg total) by mouth daily. To be used with 30 day free trial card.  30 capsule  0  . minocycline (MINOCIN,DYNACIN) 100 MG capsule       . oxybutynin (DITROPAN) 5 MG  tablet Take 5 mg by mouth 3 (three) times daily.      Marland Kitchen PROAIR HFA 108 (90 BASE) MCG/ACT inhaler       . risperiDONE (RISPERDAL) 0.25 MG tablet       . rOPINIRole (REQUIP) 2 MG tablet Take 1 tablet (2 mg total) by mouth at bedtime.  90 tablet  0  . sertraline (ZOLOFT) 100 MG tablet Take 100 mg by mouth 2 (two) times daily.       . SF 5000 PLUS 1.1 % CREA dental cream       . simvastatin (ZOCOR) 20 MG tablet Take 20 mg by mouth every evening.      . traMADol (ULTRAM) 50 MG tablet Take 50 mg by mouth every 6 (six) hours as needed for pain.      Marland Kitchen travoprost, benzalkonium, (TRAVATAN) 0.004 % ophthalmic solution Place 1 drop into both eyes every other day.        Exam:  BP 138/92  Pulse 101  Temp(Src) 97.7 F (36.5 C) (Oral)  Resp 18  SpO2 98% Gen: Well NAD HEENT: EOMI,  MMM Lungs: Normal work of  breathing. CTABL Heart: RRR no MRG Chest wall: Mildly tender right lower lateral. No crepitations palpated. Abd: NABS, Soft. NT, ND negative Murphy sign Exts: Brisk capillary refill, warm and well perfused.   No results found for this or any previous visit (from the past 24 hour(s)). Dg Ribs Unilateral W/chest Right  01/17/2014   CLINICAL DATA:  Fall, right rib pain.  EXAM: RIGHT RIBS AND CHEST - 3+ VIEW  COMPARISON:  DG ESOPHAGUS-BA SW dated 11/20/2012; DG CHEST 2 VIEW dated 11/27/2010  FINDINGS: Cardiomediastinal silhouette is nonsuspicious, status post median sternotomy. The lungs are clear without pleural effusions or focal consolidations. Trachea projects midline and there is no pneumothorax. Soft tissue planes and included osseous structures are non-suspicious. No rib fracture deformity. Broad thoracic dextroscoliosis. Possible surgical clips in right neck could be external to patient.  IMPRESSION: No acute cardiopulmonary process or rib fracture deformity.   Electronically Signed   By: Elon Alas   On: 01/17/2014 21:01    Assessment and Plan: 73 y.o. male with rib contusion. Plan to  treat with ibuprofen. Follow up with primary care provider as needed  Discussed warning signs or symptoms. Please see discharge instructions. Patient expresses understanding.    Gregor Hams, MD 01/17/14 2120

## 2014-01-18 ENCOUNTER — Telehealth: Payer: Self-pay | Admitting: Neurology

## 2014-01-18 NOTE — Telephone Encounter (Signed)
Please call her and schedule him into a follow up slot. Thanks.

## 2014-01-18 NOTE — Telephone Encounter (Signed)
Called pt's wife Arlene concerning the message that she left about the pt falling and having to go to the urgent care. Pt's wife stated that there wasn't any broken, the pt just bruise a little. Pt's states that the pt has been falling a lot and was wondering if the pt needs to come sooner than May to see Dr. Janann Colonel. Please advise

## 2014-01-18 NOTE — Telephone Encounter (Signed)
Called pt and spoke with pt's wife Janae Bridgeman to make an sooner appt on 01/23/14 with Dr. Janann Colonel. I advised the pt's wife that if the pt has any other problems, questions or concerns to call the office. Pt's wife verbalized understanding.

## 2014-01-23 ENCOUNTER — Telehealth: Payer: Self-pay | Admitting: Neurology

## 2014-01-23 ENCOUNTER — Ambulatory Visit: Payer: Self-pay | Admitting: Neurology

## 2014-01-23 NOTE — Telephone Encounter (Signed)
No show for 01/23/2014 follow up

## 2014-01-30 ENCOUNTER — Ambulatory Visit: Payer: Medicare Other | Attending: Neurology | Admitting: Physical Therapy

## 2014-01-30 DIAGNOSIS — R269 Unspecified abnormalities of gait and mobility: Secondary | ICD-10-CM | POA: Insufficient documentation

## 2014-01-30 DIAGNOSIS — IMO0001 Reserved for inherently not codable concepts without codable children: Secondary | ICD-10-CM | POA: Insufficient documentation

## 2014-02-01 ENCOUNTER — Ambulatory Visit: Payer: Medicare Other | Admitting: Neurology

## 2014-02-06 ENCOUNTER — Ambulatory Visit: Payer: Medicare Other | Admitting: Physical Therapy

## 2014-02-07 ENCOUNTER — Other Ambulatory Visit: Payer: Self-pay | Admitting: Neurology

## 2014-02-07 ENCOUNTER — Ambulatory Visit (INDEPENDENT_AMBULATORY_CARE_PROVIDER_SITE_OTHER): Payer: Medicare Other | Admitting: Podiatrist

## 2014-02-07 ENCOUNTER — Encounter: Payer: Self-pay | Admitting: Podiatrist

## 2014-02-07 VITALS — BP 145/78 | HR 65 | Resp 17 | Ht 69.0 in | Wt 155.0 lb

## 2014-02-07 DIAGNOSIS — M79609 Pain in unspecified limb: Secondary | ICD-10-CM

## 2014-02-07 DIAGNOSIS — B351 Tinea unguium: Secondary | ICD-10-CM

## 2014-02-08 NOTE — Telephone Encounter (Signed)
Patient no showed last appt, but has another one scheduled in May

## 2014-02-08 NOTE — Progress Notes (Signed)
HPI: Patient presents today for follow up of foot and nail care. Relates worsening pain in toes from flexion contracture on the left greater than right feet from parkinsons. Neurologist is going to treat with botox injections when insurance approval is granted.   Objective: Patients chart is reviewed. Neurovascular status unchanged. Patients nails are thickened, discolored, distrophic, friable and brittle with yellow-brown discoloration. Patient subjectively relates they are painful with shoes and with ambulation of bilateral feet. Pain in the toes is present from plantarflexion due to Parkinson's.   Assessment: Symptomatic onychomycosis, Parkinson's related toe contracture- pain   Plan: Discussed treatment options and alternatives. The symptomatic toenails were debrided through manual an mechanical means without complication. Recommended pursuing the Botox injections as recommended by the patient's neurologist. Return appointment recommended at routine intervals of 3 months for routine care.

## 2014-02-13 ENCOUNTER — Ambulatory Visit: Payer: Medicare Other | Admitting: Physical Therapy

## 2014-02-14 ENCOUNTER — Encounter: Payer: Self-pay | Admitting: Neurology

## 2014-02-14 ENCOUNTER — Telehealth: Payer: Self-pay | Admitting: Neurology

## 2014-02-14 ENCOUNTER — Ambulatory Visit (INDEPENDENT_AMBULATORY_CARE_PROVIDER_SITE_OTHER): Payer: Medicare Other | Admitting: Neurology

## 2014-02-14 VITALS — BP 87/59 | HR 81 | Ht 69.0 in | Wt 155.0 lb

## 2014-02-14 DIAGNOSIS — G20 Parkinson's disease: Secondary | ICD-10-CM

## 2014-02-14 NOTE — Patient Instructions (Signed)
Overall you are doing fairly well but I do want to suggest a few things today:   Remember to drink plenty of fluid, eat healthy meals and do not skip any meals. Try to eat protein with a every meal and eat a healthy snack such as fruit or nuts in between meals. Try to keep a regular sleep-wake schedule and try to exercise daily, particularly in the form of walking, 20-30 minutes a day, if you can.   As far as your medications are concerned, I would like to suggest you consider a device called Duopa. This is a new way to deliver Levodopa to the body and give you a more steady state of benefit.   Continue to take your medication at its regular schedule for now.   Please call us with any interim questions, concerns, problems, updates or refill requests.   Please also call us for any test results so we can go over those with you on the phone.  My clinical assistant and will answer any of your questions and relay your messages to me and also relay most of my messages to you.   Our phone number is [phone]. We also have an after hours call service for urgent matters and there is a physician on-call for urgent questions. For any emergencies you know to call 911 or go to the nearest emergency room

## 2014-02-14 NOTE — Telephone Encounter (Signed)
Jocelyn Lamer with [facility] [phone]) would like a call back from the nurse concerning recent orders sent for palliative care.

## 2014-02-14 NOTE — Progress Notes (Signed)
Subjective:    Patient ID: [name] [name] is a 73 y.o. male with history of PD presenting for follow up visit. Last visit was 11/2013 at which time he was started on florinef for severe orthostatic hypotension. Since last visit he has continued to have difficulties with his blood pressure. Briefly stopped florinef and then was restarted per his PCP. Has had falls since last visit. Continues to have difficulty with gait. A referral to PT was placed and the possibility of Xeomin injections for focal dystonia was also discussed. He continues to have light headed sensation, wife is checking it frequently and he continues to have severe fluctuations in blood pressure.   He expresses concern that the Sinemet is not doing what it needs to do. He feels that it is wearing off, having more off periods than on periods. He is currently taking 2 pills at 8am, 11am, 3pm and 7pm. He notes when he takes it, he gets a great benefit, described as feeling "great", able to talk better. States this lasts around 2 hours and then is off again. The off time is increasing. His wife notes some dyskinesias, unsure if it is tied to taking the medication. Notes it more at night.   He has some severe depression and depressed mood as the day goes on.   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 He is accompanied by his wife again today and presents for a sooner than scheduled appointment because of problems with his memory per wife. I last saw him on 05/08/2013, at which time his MMSE was 27/30, and his physical exam was stable, but I felt that his psychiatric illness including the OCD and his anxiety and depression played a big  role in his presentation. I recommended tapering off Comtan by taking 1 pill once daily for 3 days, then stop. His wife was encouraged to give him the Xanax 0.25 mg once daily as needed. He has been on clonazepam. His Sinemet was to stay the same for now.  Per him, he has had worsening in his fine motor skills, he tries to walk with a cane which has helped. He takes 2 pills of Sinemet at 8 AM, 1/2 pill at 10 AM, 2 at 11 AM, 2 at 3 PM and 1/2 pill at 5pm, 2pills 7pm. . He saw Dr. [name] recently and was advised to add the 1/2 pill at 10 AM. He had PT in the past. He is taking Requip 1 mg at night for RLS. Neurontin helps with L hip pain, which has been chronic for 4 years. He tried the Exelon patch for 2 days, but felt bad, including nausea.   We talked about his driving and I strongly discouraged him from driving. His wife voiced major concerns with his cognitive abilities and worried about his OCD, and she reported friction between them. She does not feel comfortable leaving him alone as he has left the stove on with a pot of water boiling away. He has also left all burners on one time. She is in counseling because she is going through a lot of stress with being his primary caretaker. He was advised to continue psychiatric followup. I did not suggest formal cognitive testing again and he had an evaluation with Dr. Valentina Shaggy in the  past.   He previously followed with Dr. Morene Antu. He has an underlying medical history of heart disease, asthma, hyperlipidemia, depression, scoliosis, detached retina. He has had intermittent confusion and swallowing problems. I first met him on 01/29/2013. His PD was diagnosed in 2004. He has had physical therapy. At the time of his first visit with me I felt that his physical exam was stable. He had quite significant anxiety. He had compulsive behavior with dopamine agonists in the past. I reduced his comtan to one pill twice daily.  He was given samples for Exelon patch. In the  past, he had SE with Aricept, Exelon and Galantamine, mostly in the form of nausea. He had not been on Exelon patch before. His wife was particularly worried about his cognitive decline. He feels very sleepy at 11 AM almost daily and takes a nap for at least an hour daily, after which he feels better. This happens 3-4 days of the week.  He is more "hyper", and more depressed between 6 and 8 PM. His wife has had concerns with his cognitive abilities. He has become lost, he gets lost in the store, he has left all 4 burners on of the stove. His balance is not as good. He is not exercising as much.  He has had worsening mood swings, more negative at times, not as optimistic as he used to be. He has no sleep problems, no severe constipation. He is eating a lot of sweets.   His Past Medical History Is Significant For: Past Medical History  Diagnosis Date  . Scoliosis   . Heart disease   . Hypertrophy (benign) of prostate   . Asthma   . Hypercholesteremia   . Depression     His Past Surgical History Is Significant For: Past Surgical History  Procedure Laterality Date  . Refractive surgery Bilateral     2 on OD, 1 on OS  . Mitral valve repair N/A     His Family History Is Significant For: No family history on file.  His Social History Is Significant For: History   Social History  . Marital Status: Married    Spouse Name: Arleen    Number of Children: 1  . Years of Education: college   Social History Main Topics  . Smoking status: Never Smoker   . Smokeless tobacco: None  . Alcohol Use: No  . Drug Use: No  . Sexual Activity: None   Other Topics Concern  . None   Social History Narrative   Patient lives at home with his wife [name])   Patient is right handed    Education college   Caffeine consumption once daily          His Allergies Are:  Allergies  Allergen Reactions  . Aricept [Donepezil Hcl] Other (See Comments)    Confusion  . Codeine   . Namenda [Memantine  Hcl] Other (See Comments)    Urinary incontinence   . Diphenhydramine   :   His Current Medications Are:  Outpatient Encounter Prescriptions as of 02/14/2014  Medication Sig  . ALPRAZolam (XANAX) 0.25 MG tablet Take 0.25 mg by mouth at bedtime as needed for sleep.  Marland Kitchen aspirin 81 MG tablet Take 81 mg by mouth daily.  . calcium-vitamin D (OSCAL WITH D) 500-200 MG-UNIT per tablet Take 1 tablet by mouth daily.  . carbidopa-levodopa (SINEMET IR) 25-100 MG per tablet TAKE 2 TABLETS BY MOUTH 4 (FOUR) TIMES DAILY.  . chlorhexidine (PERIDEX) 0.12 % solution   .  clobetasol cream (TEMOVATE) 0.05 %   . clonazePAM (KLONOPIN) 0.5 MG tablet TAKE 1 & 1/2 TABLETS BY MOUTH AT BEDTIME  . fludrocortisone (FLORINEF) 0.1 MG tablet Take 1 tablet (0.1 mg total) by mouth daily.  . fluocinonide (LIDEX) 0.05 % external solution   . FLUZONE HIGH-DOSE injection   . gabapentin (NEURONTIN) 300 MG capsule   . latanoprost (XALATAN) 0.005 % ophthalmic solution   . Memantine HCl ER (NAMENDA XR) 7 MG CP24 Take 1 capsule (7 mg total) by mouth daily. To be used with 30 day free trial card.  . minocycline (MINOCIN,DYNACIN) 100 MG capsule   . oxybutynin (DITROPAN) 5 MG tablet Take 5 mg by mouth 3 (three) times daily.  Marland Kitchen PROAIR HFA 108 (90 BASE) MCG/ACT inhaler   . risperiDONE (RISPERDAL) 0.25 MG tablet   . rOPINIRole (REQUIP) 2 MG tablet Take 1 tablet (2 mg total) by mouth at bedtime.  . sertraline (ZOLOFT) 100 MG tablet Take 100 mg by mouth 2 (two) times daily.   . SF 5000 PLUS 1.1 % CREA dental cream   . simvastatin (ZOCOR) 20 MG tablet Take 20 mg by mouth every evening.  . traMADol (ULTRAM) 50 MG tablet Take 50 mg by mouth every 6 (six) hours as needed for pain.  Marland Kitchen travoprost, benzalkonium, (TRAVATAN) 0.004 % ophthalmic solution Place 1 drop into both eyes every other day.  : Review of Systems  HENT: Positive for hearing loss, rhinorrhea and trouble swallowing.   Genitourinary: Positive for difficulty urinating.   Musculoskeletal: Positive for arthralgias.  Skin: Positive for rash.  Neurological: Positive for speech difficulty and weakness.       Memory loss, restless leg  Psychiatric/Behavioral: Positive for confusion and dysphoric mood. The patient is nervous/anxious.     Objective:  Neurologic Exam  Physical Exam Physical Examination:   Filed Vitals:   02/14/14 0944  BP: 87/59  Pulse: 81   Orthostatic BP prone 113/72 P 72(above value is standing)  General Examination: The patient is a very pleasant 73 y.o. male in no acute distress.  HEENT: Normocephalic, atraumatic, pupils are equal, round and reactive to light and accommodation. Extraocular tracking shows mild saccadic breakdown without nystagmus noted. There is mild limitation to upper gaze. There is mild decrease in eye blink rate. Hearing is impaired with hearing aid in L ear. Face is symmetric with moderate facial masking and normal facial sensation. There is no lip, neck or jaw tremor. Neck is moderately rigid with intact passive ROM. There are no carotid bruits on auscultation. Oropharynx exam reveals mild mouth dryness. No significant airway crowding is noted. Mallampati is class II. Tongue protrudes centrally and palate elevates symmetrically.    Chest: is clear to auscultation without wheezing, rhonchi or crackles noted.  Heart: sounds are regular and normal without murmurs, rubs or gallops noted.   Abdomen: is soft, non-tender and non-distended with normal bowel sounds appreciated on auscultation.  Extremities: There is no pitting edema in the distal lower extremities bilaterally. Pedal pulses are intact.  Skin: is warm and dry with no trophic changes noted.  Musculoskeletal: exam reveals no obvious joint deformities, tenderness or joint swelling or erythema.  Neurologically:  Mental status: The patient is awake and alert, paying good  attention. He is able to to partially provide the history. His wife provides details. He  is oriented to: person, place only. His memory, attention, language and knowledge are fair.[place] 14/30 at prior visit, . There is no aphasia, agnosia, apraxia or anomia. There  is a mild degree of bradyphrenia. Speech is mildly hypophonic with minimal dysarthria noted. Mood is slightly depressed and anxious and affect is blunted.   Cranial nerves are as described above under HEENT exam. In addition, shoulder shrug is normal with equal shoulder height noted.  Motor exam: Normal bulk, and strength for age is noted. Tone is mildly rigid with presence of cogwheeling in the right upper extremity. There is overall mild bradykinesia. There is no drift or rebound. There is no tremor.  Romberg is negative. Reflexes are 2+ in the upper extremities and 2+ in the lower extremities, but trace in the ankles. Fine motor skills exam reveals: Finger taps are mildly to moderately impaired on the right and mildly impaired on the left. Hand movements are mildly impaired on the right and mildly impaired on the left. RAP (rapid alternating patting) is mildly impaired on the right and mildly impaired on the left. Foot taps are mildly to moderately impaired on the right and mildly impaired on the left. Foot agility (in the form of heel stomping) is mildly impaired on the right and mildly impaired on the left.    Cerebellar testing shows no dysmetria or intention tremor on finger to nose testing. There is no truncal or gait ataxia.   Sensory exam is intact to light touch, pinprick, vibration, temperature sense and proprioception in the upper and lower extremities.   Gait, station and balance exan: He stands up from the seated position with minimal difficulty and needs no assistance. No veering to one side is noted. He is not noted to lean to the side. Posture is mildly stooped. Stance is wide-based. He walks with decrease in stride length but good pace and decreased arm swing on the right and left. He turns in 3 steps. Tandem walk  is not possible. Balance is mildly impaired. He is not able to do a toe or heel stance.     Assessment and Plan:    1)PD 2)Orthostatic hypotension 3)Focal dystonia 4)PDD  In summary, Gean Larose is a very pleasant 73 year old male with a history of advanced Parkinson's disease, of over 10 years duration, associated with memory loss, OCD, anxiety and depression. His non-motor Sx are a big component in his case.Main concern today is fatigue, gait instability and confusion. Pertinent exam finding is marked orthostatic hypotension, suspect this is strongly contributing to this symptoms. I had a long chat with the patient and his wife again about his condition and the diagnosis of advanced PD, the prognosis and treatment options. We talked about medical treatments and non-pharmacological approaches. We talked about maintaining a healthy lifestyle in general. I encouraged patient to exercise more and use his walker consistently.   -continue Sinemet at current dose and schedule for now -continue florinef, if no improvement in orthostatic hypotension will consider use of Northera in the future -extensively discussed different options as it appears Sinemet is wearing off quicker. Will consider patient for possible intervention with Duopa ([facility]). Patient and spouse will review information and consider this option. Can also consider Rytary as an alternative -continue PT and using a walker to prevent falls -per discussion with wife will place palliative care referral    A total of 50 minutes was spent in with this patient. Over half this time was spent on counseling patient on the diagnosis and different therapeutic options available. We extensively discussed his diagnosis, prognosis and course of treatment. We discussed that unfortunately this is a progressive disorder with no cure. Counseled her  that we are limited in our current treatment options as he has tried multiple medications and been unable to  tolerate. All questions were fully answered.

## 2014-02-15 ENCOUNTER — Telehealth: Payer: Self-pay | Admitting: Neurology

## 2014-02-15 NOTE — Telephone Encounter (Signed)
I have not completed a form for this patient.  I called back to obtain more info.  Spoke with [employer].  She said this was an order for special meds that need to be administered via [employer] with home health.  She said the center section of the form with pump info needs to be completed and also said the bottom section either needs "opt in or opt out" checked.  She said if opt in is checked, they will do 3 complimentary home visits, with no charge to the patient whatsoever.  She asked that once these sections are completed, we fax the form back to them at [phone].  If there are any further questions, her direct extension is [phone].  Will forward message to the nurses for further review.

## 2014-02-15 NOTE — Telephone Encounter (Signed)
Actuary from [employer] [employer] stating that the pt's script must be filled out completely and that she can not enroll without this being done. Please advise

## 2014-02-15 NOTE — Telephone Encounter (Signed)
Viviano Simas, nurse case manager calling from [employer] [employer] states that patient's script must be completed entirely and is missing some elements. Michelle cannot enroll patient without it being filled out completely. If questions, please call her back.

## 2014-02-18 ENCOUNTER — Telehealth: Payer: Self-pay | Admitting: *Deleted

## 2014-02-18 DIAGNOSIS — R413 Other amnesia: Secondary | ICD-10-CM

## 2014-02-18 DIAGNOSIS — G20 Parkinson's disease: Secondary | ICD-10-CM

## 2014-02-18 NOTE — Telephone Encounter (Signed)
I called and spoke to wife.  Pt has had increased falls, increased confusion, Increased sundowners, increased night time urination.  May have UTI? She will contact pcp.  I did relay to wife after speaking with [facility] and [facility] of [place], that they do not do palliative care only.  They mentioned [facility].  I called them and this service is for chronic illness (sx and pain management), more consultative then providing care in the home.  (good bridge for when needs hospice).  Wife understood.   I mentioned [facility].  (program for all-inclusive care for the elderly).  They would need to change pcp, to use [facility]. (using [facility] p[facility]).  I did not know who that was.  She would be willing to talk to them.  I will place the referral after consulting Dr. Janann Colonel.

## 2014-02-18 NOTE — Telephone Encounter (Signed)
I think [facility] would be a great option for him. Thanks.

## 2014-02-18 NOTE — Telephone Encounter (Signed)
Per Dr Janann Colonel he filled out form for this pt several days ago.

## 2014-02-20 ENCOUNTER — Ambulatory Visit: Payer: Medicare Other | Admitting: Physical Therapy

## 2014-02-20 ENCOUNTER — Telehealth: Payer: Self-pay | Admitting: *Deleted

## 2014-02-20 NOTE — Telephone Encounter (Signed)
I spoke to [name] [name] [facility] hospice and palliative care.  Pt not eligible at this time due to time frame for hospice requirement. (less then 6 mo to live).

## 2014-02-20 NOTE — Telephone Encounter (Signed)
Nurse case manager calling about pts enrollment form for [facility].  She stated that the dosing was not fillled out.  I spoke with Dr. Janann Colonel and he filled out according to reps instructions.  This being for information purposes only at this time.  I called and LMVM for Ilda Mori about this.  She had previously relayed that the insurance/pharmacy requires certain information.  One) that pt has idiopathic PD , Two)  Is levo/carbido responsive with clearly defined on and off times (off times at least 3 hours), Three)  Pt has bradykinesia's and one other symptom, Four) and has been on one other documented medication.

## 2014-02-22 ENCOUNTER — Ambulatory Visit
Admission: RE | Admit: 2014-02-22 | Discharge: 2014-02-22 | Disposition: A | Discharge status: Home / Self Care | Payer: Medicare Other | Source: Ambulatory Visit | Attending: Internal Medicine | Admitting: Internal Medicine

## 2014-02-22 ENCOUNTER — Other Ambulatory Visit: Payer: Self-pay | Admitting: Internal Medicine

## 2014-02-22 DIAGNOSIS — R10811 Right upper quadrant abdominal tenderness: Secondary | ICD-10-CM

## 2014-02-22 DIAGNOSIS — R142 Eructation: Principal | ICD-10-CM

## 2014-02-22 DIAGNOSIS — R143 Flatulence: Principal | ICD-10-CM

## 2014-02-22 DIAGNOSIS — R141 Gas pain: Secondary | ICD-10-CM

## 2014-02-25 ENCOUNTER — Ambulatory Visit: Admission: RE | Admit: 2014-02-25 | Payer: Medicare Other | Source: Ambulatory Visit

## 2014-02-25 ENCOUNTER — Ambulatory Visit
Admission: RE | Admit: 2014-02-25 | Discharge: 2014-02-25 | Disposition: A | Discharge status: Home / Self Care | Payer: Medicare Other | Source: Ambulatory Visit | Attending: Internal Medicine | Admitting: Internal Medicine

## 2014-02-25 ENCOUNTER — Other Ambulatory Visit: Payer: Self-pay | Admitting: Internal Medicine

## 2014-02-25 DIAGNOSIS — R1011 Right upper quadrant pain: Secondary | ICD-10-CM

## 2014-02-26 ENCOUNTER — Other Ambulatory Visit: Payer: Medicare Other

## 2014-02-28 ENCOUNTER — Telehealth: Payer: Self-pay | Admitting: *Deleted

## 2014-02-28 NOTE — Telephone Encounter (Signed)
That is fine if they want to adjust the doses by 15 to 30 minutes. Thanks.

## 2014-02-28 NOTE — Telephone Encounter (Signed)
I called wife and [name] [name] back and relayed the message below.   They will try moving up 1500 dose to 1430, since that was when noticing issues.  See how that does and may advance to other times if needed.  [name] [name] verbalized understanding.

## 2014-02-28 NOTE — Telephone Encounter (Signed)
[facility] care provider in the home, calling to see if they can manipulate the sinemet on the pt.  He takes now sinemet 25-100 [id number].  Around 1330-1400 notices coordination off.  Asking if can give sinemet 15-30 min sooner on solo dose prn or all doses?  If they need to.  Has had some freezing and falls.  Dr. Laurann Montana manipulating Bp meds per wife.

## 2014-03-02 ENCOUNTER — Emergency Department (INDEPENDENT_AMBULATORY_CARE_PROVIDER_SITE_OTHER): Payer: Medicare Other

## 2014-03-02 ENCOUNTER — Encounter (HOSPITAL_COMMUNITY): Payer: Self-pay | Admitting: Emergency Medicine

## 2014-03-02 ENCOUNTER — Emergency Department (INDEPENDENT_AMBULATORY_CARE_PROVIDER_SITE_OTHER)
Admission: EM | Admit: 2014-03-02 | Discharge: 2014-03-02 | Disposition: A | Discharge status: Home / Self Care | Payer: Medicare Other | Source: Home / Self Care

## 2014-03-02 DIAGNOSIS — Y92009 Unspecified place in unspecified non-institutional (private) residence as the place of occurrence of the external cause: Secondary | ICD-10-CM

## 2014-03-02 DIAGNOSIS — S300XXA Contusion of lower back and pelvis, initial encounter: Secondary | ICD-10-CM

## 2014-03-02 DIAGNOSIS — W19XXXA Unspecified fall, initial encounter: Secondary | ICD-10-CM

## 2014-03-02 NOTE — ED Provider Notes (Signed)
CSN: [id number]     Arrival date & time 03/02/14  1726 History   First MD Initiated Contact with Patient 03/02/14 1742     Chief Complaint  Patient presents with  . Fall   (Consider location/radiation/quality/duration/timing/severity/associated sxs/prior Treatment) HPI Comments: 73 y o m with hx of Parkinsons Dz. While standing in the kitchen he fell to the floor onto his buttocks. C/O soreness to the L posterior buttock. Wants to know if he broke it.  Denies striking other body areas Denies head, neck back, chest , abdomen, upper extremity or RLE pain or injury. After the fall he has taken several steps with wt bearing and assistance with a walker to the car and the urgent care.   Past Medical History  Diagnosis Date  . Scoliosis   . Heart disease   . Hypertrophy (benign) of prostate   . Asthma   . Hypercholesteremia   . Depression    Past Surgical History  Procedure Laterality Date  . Refractive surgery Bilateral     2 on OD, 1 on OS  . Mitral valve repair N/A    No family history on file. History  Substance Use Topics  . Smoking status: Never Smoker   . Smokeless tobacco: Not on file  . Alcohol Use: No    Review of Systems  Constitutional: Negative for fever, activity change and fatigue.  HENT: Negative.   Respiratory: Negative.   Cardiovascular: Negative.   Genitourinary: Negative.   Musculoskeletal: Negative for back pain, neck pain and neck stiffness.  Neurological: Negative for dizziness, tremors, speech difficulty and light-headedness.    Allergies  Aricept; Codeine; Namenda; and Diphenhydramine  Home Medications   Prior to Admission medications   Medication Sig Start Date End Date Taking? Authorizing Provider  ALPRAZolam Duanne Moron) 0.25 MG tablet Take 0.25 mg by mouth at bedtime as needed for sleep.    Historical Provider, MD  aspirin 81 MG tablet Take 81 mg by mouth daily.    Historical Provider, MD  calcium-vitamin D (OSCAL WITH D) 500-200 MG-UNIT per  tablet Take 1 tablet by mouth daily.    Historical Provider, MD  carbidopa-levodopa (SINEMET IR) 25-100 MG per tablet TAKE 2 TABLETS BY MOUTH 4 (FOUR) TIMES DAILY.    Hulen Luster, DO  chlorhexidine (PERIDEX) 0.12 % solution  11/20/13   Historical Provider, MD  clobetasol cream (TEMOVATE) 0.05 %  06/15/13   Historical Provider, MD  clonazePAM (KLONOPIN) 0.5 MG tablet TAKE 1 & 1/2 TABLETS BY MOUTH AT BEDTIME 12/24/13   Hulen Luster, DO  fludrocortisone (FLORINEF) 0.1 MG tablet Take 1 tablet (0.1 mg total) by mouth daily. 11/28/13   Hulen Luster, DO  fluocinonide (LIDEX) 0.05 % external solution  01/25/13   Historical Provider, MD  FLUZONE HIGH-DOSE injection  07/25/13   Historical Provider, MD  gabapentin (NEURONTIN) 300 MG capsule  12/20/12   Historical Provider, MD  latanoprost (XALATAN) 0.005 % ophthalmic solution  12/09/12   Historical Provider, MD  Memantine HCl ER (NAMENDA XR) 7 MG CP24 Take 1 capsule (7 mg total) by mouth daily. To be used with 30 day free trial card. 08/03/13   Star Age, MD  minocycline (MINOCIN,DYNACIN) 100 MG capsule  01/25/13   Historical Provider, MD  oxybutynin (DITROPAN) 5 MG tablet Take 5 mg by mouth 3 (three) times daily.    Historical Provider, MD  PROAIR HFA 108 (90 BASE) MCG/ACT inhaler  07/09/13   Historical Provider, MD  risperiDONE (RISPERDAL) 0.25 MG  tablet  10/01/13   Historical Provider, MD  rOPINIRole (REQUIP) 2 MG tablet Take 1 tablet (2 mg total) by mouth at bedtime. 07/20/13   Star Age, MD  sertraline (ZOLOFT) 100 MG tablet Take 100 mg by mouth 2 (two) times daily.     Historical Provider, MD  SF 5000 PLUS 1.1 % CREA dental cream  04/17/13   Historical Provider, MD  simvastatin (ZOCOR) 20 MG tablet Take 20 mg by mouth every evening.    Historical Provider, MD  traMADol (ULTRAM) 50 MG tablet Take 50 mg by mouth every 6 (six) hours as needed for pain.    Historical Provider, MD  travoprost, benzalkonium, (TRAVATAN) 0.004 % ophthalmic solution  Place 1 drop into both eyes every other day.    Historical Provider, MD   BP 141/61  Pulse 60  Temp(Src) 97.9 F (36.6 C) (Oral)  Resp 16  SpO2 100% Physical Exam  Nursing note and vitals reviewed. Constitutional: He is oriented to person, place, and time. He appears well-nourished. No distress.  HENT:  Head: Normocephalic and atraumatic.  Neck: Normal range of motion.  Cardiovascular: Normal rate.   Pulmonary/Chest: Effort normal. No respiratory distress.  Abdominal: Soft. There is no tenderness.  Musculoskeletal:  Mild tenderness to the left upper buttock and lateral hip musculature. No deformity. Able to stand on both LE's with good balance. No deformity, swelling. LE knee extension and flexion nl. No tenderness to the back, spine or Right hip.  Neurological: He is alert and oriented to person, place, and time. He exhibits normal muscle tone.  Skin: Skin is warm and dry.  Psychiatric: He has a normal mood and affect.    ED Course  Procedures (including critical care time) Labs Review Labs Reviewed - No data to display  Imaging Review Dg Hip Complete Left  03/02/2014   CLINICAL DATA:  FALL  EXAM: LEFT HIP - COMPLETE 2+ VIEW  COMPARISON:  None.  FINDINGS: There is no evidence of hip fracture or dislocation. There is no evidence of arthropathy or other focal bone abnormality.  IMPRESSION: Negative.   Electronically Signed   By: Margaree Mackintosh M.D.   On: 03/02/2014 18:11     MDM   1. Fall at home   2. Contusion, buttock    Ice as needed. For worsening or new sx's may return, otherwise f/u with your PCP      Janne Napoleon, NP 03/02/14 Vernelle Emerald

## 2014-03-02 NOTE — ED Provider Notes (Signed)
Medical screening examination/treatment/procedure(s) were performed by non-physician practitioner and as supervising physician I was immediately available for consultation/collaboration.  Philipp Deputy, M.D.  Harden Mo, MD 03/02/14 [phone]

## 2014-03-02 NOTE — ED Notes (Signed)
Pt reports he fell around 1550 inside his home States he has parkinson's; fell onto wooden floor in the kitchen Landed on his buttocks; wants to make sure he did not break any bones  Denies LOC/head inj Alert w/no signs of acute distress.

## 2014-03-02 NOTE — Discharge Instructions (Signed)
Contusion A contusion is a deep bruise. Contusions are the result of an injury that caused bleeding under the skin. The contusion may turn blue, purple, or yellow. Minor injuries will give you a painless contusion, but more severe contusions may stay painful and swollen for a few weeks.  CAUSES  A contusion is usually caused by a blow, trauma, or direct force to an area of the body. SYMPTOMS   Swelling and redness of the injured area.  Bruising of the injured area.  Tenderness and soreness of the injured area.  Pain. DIAGNOSIS  The diagnosis can be made by taking a history and physical exam. An X-ray, CT scan, or MRI may be needed to determine if there were any associated injuries, such as fractures. TREATMENT  Specific treatment will depend on what area of the body was injured. In general, the best treatment for a contusion is resting, icing, elevating, and applying cold compresses to the injured area. Over-the-counter medicines may also be recommended for pain control. Ask your caregiver what the best treatment is for your contusion. HOME CARE INSTRUCTIONS   Put ice on the injured area.  Put ice in a plastic bag.  Place a towel between your skin and the bag.  Leave the ice on for 15-20 minutes, 03-04 times a day.  Only take over-the-counter or prescription medicines for pain, discomfort, or fever as directed by your caregiver. Your caregiver may recommend avoiding anti-inflammatory medicines (aspirin, ibuprofen, and naproxen) for 48 hours because these medicines may increase bruising.  Rest the injured area.  If possible, elevate the injured area to reduce swelling. SEEK IMMEDIATE MEDICAL CARE IF:   You have increased bruising or swelling.  You have pain that is getting worse.  Your swelling or pain is not relieved with medicines. MAKE SURE YOU:   Understand these instructions.  Will watch your condition.  Will get help right away if you are not doing well or get  worse. Document Released: 07/21/2005 Document Revised: 01/03/2012 Document Reviewed: 08/16/2011 [facility] Patient Information 2014 [place], [place].  Fall Prevention and Home Safety Falls cause injuries and can affect all age groups. It is possible to use preventive measures to significantly decrease the likelihood of falls. There are many simple measures which can make your home safer and prevent falls. OUTDOORS  Repair cracks and edges of walkways and driveways.  Remove high doorway thresholds.  Trim shrubbery on the main path into your home.  Have good outside lighting.  Clear walkways of tools, rocks, debris, and clutter.  Check that handrails are not broken and are securely fastened. Both sides of steps should have handrails.  Have leaves, snow, and ice cleared regularly.  Use sand or salt on walkways during winter months.  In the garage, clean up grease or oil spills. BATHROOM  Install night lights.  Install grab bars by the toilet and in the tub and shower.  Use non-skid mats or decals in the tub or shower.  Place a plastic non-slip stool in the shower to sit on, if needed.  Keep floors dry and clean up all water on the floor immediately.  Remove soap buildup in the tub or shower on a regular basis.  Secure bath mats with non-slip, double-sided rug tape.  Remove throw rugs and tripping hazards from the floors. BEDROOMS  Install night lights.  Make sure a bedside light is easy to reach.  Do not use oversized bedding.  Keep a telephone by your bedside.  Have a firm chair  with side arms to use for getting dressed.  Remove throw rugs and tripping hazards from the floor. KITCHEN  Keep handles on pots and pans turned toward the center of the stove. Use back burners when possible.  Clean up spills quickly and allow time for drying.  Avoid walking on wet floors.  Avoid hot utensils and knives.  Position shelves so they are not too high or low.  Place  commonly used objects within easy reach.  If necessary, use a sturdy step stool with a grab bar when reaching.  Keep electrical cables out of the way.  Do not use floor polish or wax that makes floors slippery. If you must use wax, use non-skid floor wax.  Remove throw rugs and tripping hazards from the floor. STAIRWAYS  Never leave objects on stairs.  Place handrails on both sides of stairways and use them. Fix any loose handrails. Make sure handrails on both sides of the stairways are as long as the stairs.  Check carpeting to make sure it is firmly attached along stairs. Make repairs to worn or loose carpet promptly.  Avoid placing throw rugs at the top or bottom of stairways, or properly secure the rug with carpet tape to prevent slippage. Get rid of throw rugs, if possible.  Have an electrician put in a light switch at the top and bottom of the stairs. OTHER FALL PREVENTION TIPS  Wear low-heel or rubber-soled shoes that are supportive and fit well. Wear closed toe shoes.  When using a stepladder, make sure it is fully opened and both spreaders are firmly locked. Do not climb a closed stepladder.  Add color or contrast paint or tape to grab bars and handrails in your home. Place contrasting color strips on first and last steps.  Learn and use mobility aids as needed. Install an electrical emergency response system.  Turn on lights to avoid dark areas. Replace light bulbs that burn out immediately. Get light switches that glow.  Arrange furniture to create clear pathways. Keep furniture in the same place.  Firmly attach carpet with non-skid or double-sided tape.  Eliminate uneven floor surfaces.  Select a carpet pattern that does not visually hide the edge of steps.  Be aware of all pets. OTHER HOME SAFETY TIPS  Set the water temperature for 120 F (48.8 C).  Keep emergency numbers on or near the telephone.  Keep smoke detectors on every level of the home and near  sleeping areas. Document Released: 10/01/2002 Document Revised: 04/11/2012 Document Reviewed: 12/31/2011 [facility] Patient Information 2014 [place].

## 2014-03-13 ENCOUNTER — Telehealth: Payer: Self-pay | Admitting: Neurology

## 2014-03-13 NOTE — Telephone Encounter (Signed)
Roanna Epley with [facility] wanted to check the status of patient moving forward with [facility]? If Dr Janann Colonel wanted to have access to [facility], please provide email address.  Thanks

## 2014-03-19 ENCOUNTER — Telehealth: Payer: Self-pay | Admitting: Neurology

## 2014-03-19 NOTE — Telephone Encounter (Signed)
Pt's information was given to [place], [place], Dr. Hazle Quant nurse. Please advise

## 2014-03-20 ENCOUNTER — Encounter: Payer: Self-pay | Admitting: Neurology

## 2014-03-20 ENCOUNTER — Encounter (INDEPENDENT_AMBULATORY_CARE_PROVIDER_SITE_OTHER): Payer: Self-pay

## 2014-03-20 ENCOUNTER — Ambulatory Visit (INDEPENDENT_AMBULATORY_CARE_PROVIDER_SITE_OTHER): Payer: Medicare Other | Admitting: Neurology

## 2014-03-20 VITALS — BP 155/93 | HR 68 | Ht 69.0 in | Wt 147.0 lb

## 2014-03-20 DIAGNOSIS — I951 Orthostatic hypotension: Secondary | ICD-10-CM

## 2014-03-20 DIAGNOSIS — R413 Other amnesia: Secondary | ICD-10-CM

## 2014-03-20 DIAGNOSIS — G20 Parkinson's disease: Secondary | ICD-10-CM

## 2014-03-20 MED ORDER — CARBIDOPA-LEVODOPA ER 36.25-145 MG PO CPCR: 36.2500 mg | ORAL_CAPSULE | Freq: Three times a day (TID) | ORAL | Status: DC

## 2014-03-20 MED ORDER — GABAPENTIN 300 MG PO CAPS: 300.0000 mg | ORAL_CAPSULE | Freq: Two times a day (BID) | ORAL | Status: DC

## 2014-03-20 NOTE — Progress Notes (Signed)
Subjective:    Patient ID: [name] [name] is a 73 y.o. male with history of PD presenting for follow up visit. Last visit was 01/2014 at which time we discussed the options of Duopa vs Rytari. Since last visit he suffered a fall and was brought to the ED where the evaluation was unremarkable. Patient reports feeling terrible since his last visit, feels that he has gotten worse since the last visit. He is currently staying at "[facility]" for some intensive rehab. He is not happy with this and feels it is not a good place for him. His wife is unsure how long he is scheduled to be there. His wife notes increased freezing episodes and continued difficulty with his blood pressure. His wife notes increased anxiety and depression. They are not interested in duopa.   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 He is accompanied by his wife again today and presents for a sooner than scheduled appointment because of problems with his memory per wife. I last saw him on 05/08/2013, at which time his MMSE was 27/30, and his physical exam was stable, but I felt that his psychiatric illness including the OCD and his anxiety and depression played a big role in his presentation. I recommended tapering off Comtan by taking 1 pill once daily for 3 days, then stop. His wife was encouraged to give him the Xanax 0.25 mg once daily as needed. He has been on clonazepam. His Sinemet was to stay the same for now.  Per him, he has had worsening in his fine motor skills, he tries to walk with a cane which has helped. He takes 2 pills of Sinemet at 8 AM, 1/2 pill at 10 AM, 2 at 11 AM, 2 at 3 PM and 1/2 pill at 5pm, 2pills 7pm. .  He saw Dr. Maudry [name] recently and was advised to add the 1/2 pill at 10 AM. He had PT in the past. He is taking Requip 1 mg at night for RLS. Neurontin helps with L hip pain, which has been chronic for 4 years. He tried the Exelon patch for 2 days, but felt bad, including nausea.   We talked about his driving and I strongly discouraged him from driving. His wife voiced major concerns with his cognitive abilities and worried about his OCD, and she reported friction between them. She does not feel comfortable leaving him alone as he has left the stove on with a pot of water boiling away. He has also left all burners on one time. She is in counseling because she is going through a lot of stress with being his primary caretaker. He was advised to continue psychiatric followup. I did not suggest formal cognitive testing again and he had an evaluation with Dr. Valentina [name] in the past.   He previously followed with Dr. Morene [name]. He has an underlying medical history of heart disease, asthma, hyperlipidemia, depression, scoliosis, detached retina. He has had intermittent confusion and swallowing problems. I first met him on 01/29/2013. His PD was diagnosed in 2004. He has had physical therapy. At the time of his first visit with me I felt that his physical exam was stable. He had quite significant anxiety. He had compulsive behavior with dopamine agonists in the past. I reduced his comtan  to one pill twice daily.  He was given samples for Exelon patch. In the past, he had SE with Aricept, Exelon and Galantamine, mostly in the form of nausea. He had not been on Exelon patch before. His wife was particularly worried about his cognitive decline. He feels very sleepy at 11 AM almost daily and takes a nap for at least an hour daily, after which he feels better. This happens 3-4 days of the week.  He is more "hyper", and more depressed between 6 and 8 PM. His wife has had concerns with his cognitive abilities. He has become lost,  he gets lost in the store, he has left all 4 burners on of the stove. His balance is not as good. He is not exercising as much.  He has had worsening mood swings, more negative at times, not as optimistic as he used to be. He has no sleep problems, no severe constipation. He is eating a lot of sweets.   His Past Medical History Is Significant For: Past Medical History  Diagnosis Date  . Scoliosis   . Heart disease   . Hypertrophy (benign) of prostate   . Asthma   . Hypercholesteremia   . Depression     His Past Surgical History Is Significant For: Past Surgical History  Procedure Laterality Date  . Refractive surgery Bilateral     2 on OD, 1 on OS  . Mitral valve repair N/A     His Family History Is Significant For: No family history on file.  His Social History Is Significant For: History   Social History  . Marital Status: Married    Spouse Name: [name] [name]    Number of Children: 1  . Years of Education: college   Social History Main Topics  . Smoking status: Never Smoker   . Smokeless tobacco: None  . Alcohol Use: No  . Drug Use: No  . Sexual Activity: None   Other Topics Concern  . None   Social History Narrative   Patient lives at home with his wife [name] [name])   Patient is right handed    Education college   Caffeine consumption once daily          His Allergies Are:  Allergies  Allergen Reactions  . Aricept [Donepezil Hcl] Other (See Comments)    Confusion  . Codeine   . Namenda [Memantine Hcl] Other (See Comments)    Urinary incontinence   . Diphenhydramine   :   His Current Medications Are:  Outpatient Encounter Prescriptions as of 03/20/2014  Medication Sig  . ALPRAZolam (XANAX) 0.25 MG tablet Take 0.25 mg by mouth at bedtime as needed for sleep.  Marland Kitchen aspirin 81 MG tablet Take 81 mg by mouth daily.  . calcium-vitamin D (OSCAL WITH D) 500-200 MG-UNIT per tablet Take 1 tablet by mouth daily.  . carbidopa-levodopa (SINEMET IR) 25-100 MG per tablet  TAKE 2 TABLETS BY MOUTH 4 (FOUR) TIMES DAILY.  . chlorhexidine (PERIDEX) 0.12 % solution   . clobetasol cream (TEMOVATE) 0.05 %   . clonazePAM (KLONOPIN) 0.5 MG tablet TAKE 1 & 1/2 TABLETS BY MOUTH AT BEDTIME  . fludrocortisone (FLORINEF) 0.1 MG tablet Take 1 tablet (0.1 mg total) by mouth daily.  . fluocinonide (LIDEX) 0.05 % external solution   . FLUZONE HIGH-DOSE injection   . gabapentin (NEURONTIN) 300 MG capsule   . KLOR-CON M10 10 MEQ tablet   . latanoprost (XALATAN) 0.005 % ophthalmic solution   . lidocaine (LIDODERM) 5 %   .  Memantine HCl ER (NAMENDA XR) 7 MG CP24 Take 1 capsule (7 mg total) by mouth daily. To be used with 30 day free trial card.  . minocycline (MINOCIN,DYNACIN) 100 MG capsule   . MYRBETRIQ 25 MG TB24 tablet   . oxybutynin (DITROPAN) 5 MG tablet Take 5 mg by mouth 3 (three) times daily.  Marland Kitchen PROAIR HFA 108 (90 BASE) MCG/ACT inhaler   . risperiDONE (RISPERDAL) 0.25 MG tablet   . rOPINIRole (REQUIP) 2 MG tablet Take 1 tablet (2 mg total) by mouth at bedtime.  . sertraline (ZOLOFT) 100 MG tablet Take 100 mg by mouth 2 (two) times daily.   . SF 5000 PLUS 1.1 % CREA dental cream   . simvastatin (ZOCOR) 20 MG tablet Take 20 mg by mouth every evening.  . traMADol (ULTRAM) 50 MG tablet Take 50 mg by mouth every 6 (six) hours as needed for pain.  Marland Kitchen travoprost, benzalkonium, (TRAVATAN) 0.004 % ophthalmic solution Place 1 drop into both eyes every other day.  : Review of Systems  HENT: Positive for hearing loss, rhinorrhea and trouble swallowing.   Genitourinary: Positive for difficulty urinating.  Musculoskeletal: Positive for arthralgias.  Skin: Positive for rash.  Neurological: Positive for speech difficulty and weakness.       Memory loss, restless leg  Psychiatric/Behavioral: Positive for confusion and dysphoric mood. The patient is nervous/anxious.     Objective:  Neurologic Exam  Physical Exam Physical Examination:   Filed Vitals:   03/20/14 1424  BP:  155/93  Pulse: 68     General Examination: The patient is a very pleasant 73 y.o. male in no acute distress.  HEENT: Normocephalic, atraumatic, pupils are equal, round and reactive to light and accommodation. Extraocular tracking shows mild saccadic breakdown without nystagmus noted. There is mild limitation to upper gaze. There is mild decrease in eye blink rate. Hearing is impaired with hearing aid in L ear. Face is symmetric with moderate facial masking and normal facial sensation. There is no lip, neck or jaw tremor. Neck is moderately rigid with intact passive ROM. There are no carotid bruits on auscultation. Oropharynx exam reveals mild mouth dryness. No significant airway crowding is noted. Mallampati is class II. Tongue protrudes centrally and palate elevates symmetrically.    Chest: is clear to auscultation without wheezing, rhonchi or crackles noted.  Heart: sounds are regular and normal without murmurs, rubs or gallops noted.   Abdomen: is soft, non-tender and non-distended with normal bowel sounds appreciated on auscultation.  Extremities: There is no pitting edema in the distal lower extremities bilaterally. Pedal pulses are intact.  Skin: is warm and dry with no trophic changes noted.  Musculoskeletal: exam reveals no obvious joint deformities, tenderness or joint swelling or erythema.  Neurologically:  Mental status: The patient is awake and alert, paying good  attention. He is able to to partially provide the history. His wife provides details. He is oriented to: person, place only. His memory, attention, language and knowledge are fair.[place] 14/30 at prior visit, . There is no aphasia, agnosia, apraxia or anomia. There is a mild degree of bradyphrenia. Speech is mildly hypophonic with minimal dysarthria noted. Mood is slightly depressed and anxious and affect is blunted.   Cranial nerves are as described above under HEENT exam. In addition, shoulder shrug is normal with equal  shoulder height noted.  Motor exam: Normal bulk, and strength for age is noted. Tone is mildly rigid with presence of cogwheeling in the right upper extremity. There is  overall mild bradykinesia. There is no drift or rebound. There is no tremor.  Romberg is negative. Reflexes are 2+ in the upper extremities and 2+ in the lower extremities, but trace in the ankles. Fine motor skills exam reveals: Finger taps are mildly to moderately impaired on the right and mildly impaired on the left. Hand movements are mildly impaired on the right and mildly impaired on the left. RAP (rapid alternating patting) is mildly impaired on the right and mildly impaired on the left. Foot taps are mildly to moderately impaired on the right and mildly impaired on the left. Foot agility (in the form of heel stomping) is mildly impaired on the right and mildly impaired on the left.    Cerebellar testing shows no dysmetria or intention tremor on finger to nose testing. There is no truncal or gait ataxia.   Sensory exam is intact to light touch, pinprick, vibration, temperature sense and proprioception in the upper and lower extremities.   Gait, station and balance exan: He stands up from the seated position with minimal difficulty and needs no assistance. No veering to one side is noted. He is not noted to lean to the side. Posture is mildly stooped. Stance is wide-based. He walks with decrease in stride length but good pace and decreased arm swing on the right and left. He turns in 3 steps. Tandem walk is not possible. Balance is mildly impaired. He is not able to do a toe or heel stance.     Assessment and Plan:    1)PD 2)Orthostatic hypotension 3)Focal dystonia 4)PDD  In summary, Alcus Bradly is a very pleasant 73 year old male with a history of advanced Parkinson's disease, of over 10 years duration, associated with memory loss, OCD, anxiety and depression. His non-motor Sx are a big component in his case.Main concern  today is fatigue, gait instability and confusion. Pertinent exam finding is marked orthostatic hypotension, suspect this is strongly contributing to this symptoms. I had a long chat with the patient and his wife again about his condition and the diagnosis of advanced PD, the prognosis and treatment options. We talked about medical treatments and non-pharmacological approaches. We talked about maintaining a healthy lifestyle in general. I encouraged patient to exercise more and use his walker consistently.   -will transition patient to [facility] with goal of improving wearing off episodes -continue florinef, would hold off on increasing dose at this time due to elevated BP -consider duopa in the future -continue PT and using a walker to prevent falls     A total of 50 minutes was spent in with this patient. Over half this time was spent on counseling patient on the diagnosis and different therapeutic options available. We extensively discussed his diagnosis, prognosis and course of treatment. We discussed that unfortunately this is a progressive disorder with no cure. Counseled her that we are limited in our current treatment options as he has tried multiple medications and been unable to tolerate. All questions were fully answered.

## 2014-03-20 NOTE — Telephone Encounter (Signed)
Yes please let them know. Thanks. They can have my email.

## 2014-03-20 NOTE — Telephone Encounter (Signed)
Dr. Janann Colonel your note from today says patient not interested in Octa.  Would you like me to call back Duopa person and let them know patient not interested?  Also do you want her to have your email address?

## 2014-03-20 NOTE — Patient Instructions (Signed)
Overall you are doing fairly well but I do want to suggest a few things today:   Remember to drink plenty of fluid, eat healthy meals and do not skip any meals. Try to eat protein with a every meal and eat a healthy snack such as fruit or nuts in between meals. Try to keep a regular sleep-wake schedule and try to exercise daily, particularly in the form of walking, 20-30 minutes a day, if you can.   As far as your medications are concerned, I would like to suggest the following: 1)Please decrease the Gabapentin to 300mg  twice a day 2)I would like you to start taking Rytary, which is a new formulation of Sinemet. Please take Rytary three capsules three times a day at 8am, 1pm and 6pm  Please call us with any interim questions, concerns, problems, updates or refill requests.   My clinical assistant and will answer any of your questions and relay your messages to me and also relay most of my messages to you.   Our phone number is [phone]. We also have an after hours call service for urgent matters and there is a physician on-call for urgent questions. For any emergencies you know to call 911 or go to the nearest emergency room

## 2014-03-21 ENCOUNTER — Telehealth: Payer: Self-pay | Admitting: *Deleted

## 2014-03-21 NOTE — Telephone Encounter (Signed)
Spoke to [place] and relayed that the patient was not interested in proceeding with Duopa.  Also gave her doctor's email address.

## 2014-03-21 NOTE — Telephone Encounter (Signed)
The Rx has been faxed.

## 2014-03-22 ENCOUNTER — Other Ambulatory Visit: Payer: Self-pay | Admitting: Neurology

## 2014-03-22 ENCOUNTER — Other Ambulatory Visit: Payer: Self-pay

## 2014-03-22 ENCOUNTER — Telehealth: Payer: Self-pay | Admitting: Neurology

## 2014-03-22 MED ORDER — CARBIDOPA-LEVODOPA ER 36.25-145 MG PO CPCR: 36.2500 mg | ORAL_CAPSULE | Freq: Three times a day (TID) | ORAL | Status: DC

## 2014-03-22 NOTE — Telephone Encounter (Signed)
Spoke to nurse from [facility] [employer] and she needed clarification on the medication Rytary.  I spoke to the doctor and a new RX was written and patient is to take 36.25mg  capsule,  3 capsules three times a day.  Left message at phone number given to get a fax number to send new RX.  Also asked what type of order is needed by the facility to discontinue the Carbidopa-levodopa

## 2014-03-22 NOTE — Telephone Encounter (Signed)
Facility is requesting another written Rx please.  Thank you.

## 2014-03-22 NOTE — Telephone Encounter (Signed)
[facility] nurse calling to get an order clarification for patient, states it is very important and needs a call back as soon as possible.

## 2014-03-22 NOTE — Telephone Encounter (Signed)
Joleen calling with about pt.  He had diarrhea yesterday and now is having confusion, stumbling, not responding as normal, trouble focusing, increased PD sx. (Bp 95/60).   No new meds as of this time (had not started Rytary as yet).  Pt had taken sinemet today per med tech at [facility].  Does have increased urine frequency.  Per Dr. Janann Colonel, contact pcp re: possible UTI.  New medication Rytary, is to be used instead of sinemet.  I relayed this message by VM to Joleen's cell # [phone].  I did speak with Tonja at [facility] and relayed the dosing of new medication.  Will fax to her the order to discontinue [phone].

## 2014-03-22 NOTE — Telephone Encounter (Signed)
Tanja at [facility] returning your call regarding patient--thank you.

## 2014-03-22 NOTE — Telephone Encounter (Signed)
I have faxed new prescription for Rytary and also a Rx which states to discontinue the Sinemet IR 25-100.  A confirmation has been received.

## 2014-03-22 NOTE — Telephone Encounter (Signed)
Rx has been refaxed.

## 2014-04-01 ENCOUNTER — Telehealth: Payer: Self-pay | Admitting: Neurology

## 2014-04-01 NOTE — Telephone Encounter (Signed)
I called and spoke to [place], Therapist, sports at [facility].  Pt does seem to be worse on the Rytary ( 3tabs po tid). (nausea, dizziness, gait).   I explained that per Dr. Janann Colonel that pt can go back to sinemet at previous dosage as when started the rytary if feels that pt was better on this medication.  She stated that will get fax # so as to get to her new med management plan.  Will need order to stop rytary and start sinemet.  FAX # [phone] and call her to confirm.  I called and LMVM for pts wife to return call re: med management.

## 2014-04-01 NOTE — Telephone Encounter (Signed)
Hi Sandy,  Please call her and confirm the dosing schedule. It should be 3 tabs, three times a day. Thanks.

## 2014-04-01 NOTE — Telephone Encounter (Addendum)
Spoke with wife and she said that she does not think patient is getting the correct  Carbidopa-Levodopa dosage as prescribed by Dr Janann Colonel. Called and left Vm message at [facility] ( [phone] for clarification on the dosage given. The wife said that patient is not feeling well, gait problems, not doing well.

## 2014-04-01 NOTE — Telephone Encounter (Signed)
Spouse called and stated patient is feeling very poorly. She's noticed his shuffling his feel when walking, very tired, nauseated, and low blood pressure.  Nursing facility changed Carbidopa-Levodopa ER (RYTARY) 36.25-145 MG CPCR from 3 tabs 3x day to 3 tabs once a day.  She's questioning if Dr Janann Colonel requested the dosage change.  Please call and advise

## 2014-04-01 NOTE — Telephone Encounter (Signed)
Spoke with Head Nurse Joen Laura Quick) and she said that patient is being given medication according to Dr Hazle Quant directions and she has discussed in details with wife. Called and lt VM message with wife of Ms Quick's message. The wife said that patient is not feeling well , not doing well

## 2014-04-02 NOTE — Telephone Encounter (Signed)
Wife called back.  She stated that his strength, energy level is the big thing that has been affected although shuffling more, trouble walking, drowsiness, weaker.  ? Drug, somewhat related to move, dementia?  Relayed that will speak to [facility] for PT/OT information since on drug.  Would sinemet still be helpful since he is maxed out and that was why reason for change.  Possible change back to sinemet after getting information? Mardene Celeste with MR at Dr. Wonda Amis office to fax note for 02-25-14 visit.   Called [facility] re: PT/OT Mickel Baas see's pt. Marlana Salvage will call back.

## 2014-04-02 NOTE — Telephone Encounter (Signed)
Spouse returning Sandy's call. °

## 2014-04-02 NOTE — Telephone Encounter (Signed)
Spoke to Norris with PT.  Notably a drastic change from last seen by OT.  PT new and has seen pt once last Tuesday.  Since last 1-2 wks noted that gait, motor planning, flat affect, tired, wants to sleep, does not want to do therapy.  This is a drastic change.  ? Medication SE? Progression of PD? Dementia (new enviroment?).  Received last ofv note from Dr. Laurann Montana. (to Dr. Janann Colonel then fax to [facility]).

## 2014-04-02 NOTE — Telephone Encounter (Signed)
I called and LMVM for wife to call me back.

## 2014-04-02 NOTE — Telephone Encounter (Signed)
Please have them put him back on the Sinemet 25-100 IR 2 tablets 4 times a day. Rytary is apparently not a good option for him. He is not maxed out on the Sinemet and there is still room to titrate up in the future.

## 2014-04-03 ENCOUNTER — Encounter (HOSPITAL_COMMUNITY): Payer: Self-pay | Admitting: Emergency Medicine

## 2014-04-03 ENCOUNTER — Emergency Department (HOSPITAL_COMMUNITY)
Admission: EM | Admit: 2014-04-03 | Discharge: 2014-04-03 | Disposition: A | Discharge status: Skilled Nursing Facility | Payer: Medicare Other | Attending: Emergency Medicine | Admitting: Emergency Medicine

## 2014-04-03 ENCOUNTER — Emergency Department (HOSPITAL_COMMUNITY): Payer: Medicare Other

## 2014-04-03 DIAGNOSIS — Y9389 Activity, other specified: Secondary | ICD-10-CM | POA: Insufficient documentation

## 2014-04-03 DIAGNOSIS — Z87448 Personal history of other diseases of urinary system: Secondary | ICD-10-CM | POA: Insufficient documentation

## 2014-04-03 DIAGNOSIS — Z7982 Long term (current) use of aspirin: Secondary | ICD-10-CM | POA: Insufficient documentation

## 2014-04-03 DIAGNOSIS — R296 Repeated falls: Secondary | ICD-10-CM | POA: Insufficient documentation

## 2014-04-03 DIAGNOSIS — F3289 Other specified depressive episodes: Secondary | ICD-10-CM | POA: Insufficient documentation

## 2014-04-03 DIAGNOSIS — E7800 Pure hypercholesterolemia, unspecified: Secondary | ICD-10-CM | POA: Insufficient documentation

## 2014-04-03 DIAGNOSIS — Z8679 Personal history of other diseases of the circulatory system: Secondary | ICD-10-CM | POA: Insufficient documentation

## 2014-04-03 DIAGNOSIS — F329 Major depressive disorder, single episode, unspecified: Secondary | ICD-10-CM | POA: Insufficient documentation

## 2014-04-03 DIAGNOSIS — S300XXA Contusion of lower back and pelvis, initial encounter: Secondary | ICD-10-CM | POA: Insufficient documentation

## 2014-04-03 DIAGNOSIS — Z79899 Other long term (current) drug therapy: Secondary | ICD-10-CM | POA: Insufficient documentation

## 2014-04-03 DIAGNOSIS — Y9210 Unspecified residential institution as the place of occurrence of the external cause: Secondary | ICD-10-CM | POA: Insufficient documentation

## 2014-04-03 DIAGNOSIS — J45909 Unspecified asthma, uncomplicated: Secondary | ICD-10-CM | POA: Insufficient documentation

## 2014-04-03 DIAGNOSIS — Z8739 Personal history of other diseases of the musculoskeletal system and connective tissue: Secondary | ICD-10-CM | POA: Insufficient documentation

## 2014-04-03 NOTE — ED Notes (Signed)
Brought in by EMS from [place] facility [facility]) d/t unwitnessed fall this morning.  Per EMS, pt was observed sitting on floor beside bed with c/o left buttock pain.  Pt reports that he "rolled off bed and landed on buttocks"; pt denies hitting head.  Pt presents to ED alert and oriented with some confusion (his norm) with c/o pain to his left buttock; no s/s obvious deformity to left leg noted; old bruises to lateral bilateral hips noted.

## 2014-04-03 NOTE — ED Notes (Signed)
[facility] here to transport pt back to [facility]. 

## 2014-04-03 NOTE — Telephone Encounter (Signed)
I called and spoke to wife.  Relayed the message below.  Will return to sinemet at same dosing and timing as previously taken prior to rytary.  She will take her home supply of the medication for them to use if this is possible (facility protocol).   I faxed the order to discontinue rytary and start sinemet (this to be done at beginning of the day).  [phone] Tonji at [facility].    She did relay that pt had fallen out of bed last night, taken to ED but was ok.    Called to confirm that [facility] received fax [phone]. lmvm.

## 2014-04-03 NOTE — ED Notes (Signed)
[facility] was called for pt's transport back to [facility].

## 2014-04-03 NOTE — ED Notes (Signed)
Bed: WA09 Expected date: 04/03/14 Expected time: 5:25 AM Means of arrival: Ambulance Comments: fall

## 2014-04-03 NOTE — Discharge Instructions (Signed)

## 2014-04-03 NOTE — ED Provider Notes (Signed)
CSN: [id number]     Arrival date & time 04/03/14  0534 History   First MD Initiated Contact with Patient 04/03/14 0537     Chief Complaint  Patient presents with  . Fall  . Hip Pain     (Consider location/radiation/quality/duration/timing/severity/associated sxs/prior Treatment) HPI Patient presents via EMS after an unwitnessed fall at nursing home facility. He states he was reaching for his glasses and felt that her into his buttocks. He denies hitting his head or neck. He complains of left buttock pain. No loss of consciousness. No weakness or numbness. Vital signs stable in route. Patient says she's had multiple falls and has several old bruises. Past Medical History  Diagnosis Date  . Scoliosis   . Heart disease   . Hypertrophy (benign) of prostate   . Asthma   . Hypercholesteremia   . Depression    Past Surgical History  Procedure Laterality Date  . Refractive surgery Bilateral     2 on OD, 1 on OS  . Mitral valve repair N/A    History reviewed. No pertinent family history. History  Substance Use Topics  . Smoking status: Never Smoker   . Smokeless tobacco: Not on file  . Alcohol Use: No    Review of Systems  Constitutional: Negative for fever and chills.  Respiratory: Negative for shortness of breath.   Cardiovascular: Negative for chest pain.  Gastrointestinal: Negative for nausea, vomiting and abdominal pain.  Musculoskeletal: Positive for arthralgias. Negative for back pain, neck pain and neck stiffness.  Skin: Positive for wound. Negative for rash.  Neurological: Negative for dizziness, syncope, weakness, light-headedness, numbness and headaches.  All other systems reviewed and are negative.     Allergies  Aricept; Codeine; Namenda; and Diphenhydramine  Home Medications   Prior to Admission medications   Medication Sig Start Date End Date Taking? Authorizing Provider  ALPRAZolam Duanne Moron) 0.25 MG tablet Take 0.25 mg by mouth at bedtime as needed for  sleep.    Historical Provider, MD  aspirin 81 MG tablet Take 81 mg by mouth daily.    Historical Provider, MD  calcium-vitamin D (OSCAL WITH D) 500-200 MG-UNIT per tablet Take 1 tablet by mouth daily.    Historical Provider, MD  Carbidopa-Levodopa ER (RYTARY) 36.25-145 MG CPCR Take 36.25 mg by mouth 3 (three) times daily. Take 3 capsules three times daily 03/22/14   Hulen Luster, DO  chlorhexidine (PERIDEX) 0.12 % solution  11/20/13   Historical Provider, MD  clobetasol cream (TEMOVATE) 0.05 %  06/15/13   Historical Provider, MD  clonazePAM (KLONOPIN) 0.5 MG tablet TAKE 1 & 1/2 TABLETS BY MOUTH AT BEDTIME 12/24/13   Hulen Luster, DO  fludrocortisone (FLORINEF) 0.1 MG tablet Take 1 tablet (0.1 mg total) by mouth daily. 11/28/13   Hulen Luster, DO  fluocinonide (LIDEX) 0.05 % external solution  01/25/13   Historical Provider, MD  FLUZONE HIGH-DOSE injection  07/25/13   Historical Provider, MD  gabapentin (NEURONTIN) 300 MG capsule Take 1 capsule (300 mg total) by mouth 2 (two) times daily. 03/20/14   Hulen Luster, DO  KLOR-CON M10 10 MEQ tablet  03/02/14   Historical Provider, MD  latanoprost (XALATAN) 0.005 % ophthalmic solution  12/09/12   Historical Provider, MD  lidocaine (LIDODERM) 5 %  03/04/14   Historical Provider, MD  Memantine HCl ER (NAMENDA XR) 7 MG CP24 Take 1 capsule (7 mg total) by mouth daily. To be used with 30 day free trial card. 08/03/13  Star Age, MD  minocycline (MINOCIN,DYNACIN) 100 MG capsule  01/25/13   Historical Provider, MD  MYRBETRIQ 25 MG TB24 tablet  02/18/14   Historical Provider, MD  oxybutynin (DITROPAN) 5 MG tablet Take 5 mg by mouth 3 (three) times daily.    Historical Provider, MD  PROAIR HFA 108 (90 BASE) MCG/ACT inhaler  07/09/13   Historical Provider, MD  risperiDONE (RISPERDAL) 0.25 MG tablet  10/01/13   Historical Provider, MD  rOPINIRole (REQUIP) 2 MG tablet Take 1 tablet (2 mg total) by mouth at bedtime. 07/20/13   Star Age, MD  sertraline  (ZOLOFT) 100 MG tablet Take 100 mg by mouth 2 (two) times daily.     Historical Provider, MD  SF 5000 PLUS 1.1 % CREA dental cream  04/17/13   Historical Provider, MD  simvastatin (ZOCOR) 20 MG tablet Take 20 mg by mouth every evening.    Historical Provider, MD  traMADol (ULTRAM) 50 MG tablet Take 50 mg by mouth every 6 (six) hours as needed for pain.    Historical Provider, MD  travoprost, benzalkonium, (TRAVATAN) 0.004 % ophthalmic solution Place 1 drop into both eyes every other day.    Historical Provider, MD   BP 164/92  Pulse 82  Temp(Src) 98 F (36.7 C) (Oral)  Resp 16  SpO2 100% Physical Exam  Nursing note and vitals reviewed. Constitutional: He is oriented to person, place, and time. He appears well-developed and well-nourished. No distress.  HENT:  Head: Normocephalic and atraumatic.  Mouth/Throat: Oropharynx is clear and moist.  Eyes: EOM are normal. Pupils are equal, round, and reactive to light.  Neck: Normal range of motion. Neck supple.  No posterior midline cervical tenderness to palpation.  Cardiovascular: Normal rate and regular rhythm.   Pulmonary/Chest: Effort normal and breath sounds normal. No respiratory distress. He has no wheezes. He has no rales.  Abdominal: Soft. Bowel sounds are normal. He exhibits no distension and no mass. There is no tenderness. There is no rebound and no guarding.  Musculoskeletal: Normal range of motion. He exhibits tenderness. He exhibits no edema.  Mild tenderness patient in the left buttock region. Patient has an old appearing contusion to the left lateral thigh. This is nontender to palpation. Distal range of motion and both hips. Distal pulses intact. Equal leg lengths  Neurological: He is alert and oriented to person, place, and time.  Masklike facies. Rigidity throughout. No focal weakness or numbness.  Skin: Skin is warm and dry. No rash noted. No erythema.  Psychiatric: He has a normal mood and affect. His behavior is normal.     ED Course  Procedures (including critical care time) Labs Review Labs Reviewed - No data to display  Imaging Review No results found.   EKG Interpretation None      MDM   Final diagnoses:  None   X-ray without acute findings. Patient's pain is over the buttock and not the hip. He has full range of motion of the hip without pain. I very low suspicion for any hip injury and do not believe that further imaging is necessary at this point. Will discharge back to nursing home facility. Return precautions have been given.     Julianne Rice, MD 04/03/14 [phone]

## 2014-04-04 NOTE — Telephone Encounter (Signed)
I called and relayed that fax failed twice yesterday.  Refaxed this am with confirmation.  I LMVM for Jenny Reichmann at [phone] to let know that prescription sent.

## 2014-04-08 ENCOUNTER — Encounter (HOSPITAL_COMMUNITY): Payer: Self-pay | Admitting: Emergency Medicine

## 2014-04-08 ENCOUNTER — Emergency Department (HOSPITAL_COMMUNITY): Payer: Medicare Other

## 2014-04-08 ENCOUNTER — Emergency Department (HOSPITAL_COMMUNITY)
Admission: EM | Admit: 2014-04-08 | Discharge: 2014-04-08 | Disposition: A | Discharge status: Home / Self Care | Payer: Medicare Other | Attending: Emergency Medicine | Admitting: Emergency Medicine

## 2014-04-08 DIAGNOSIS — Y9210 Unspecified residential institution as the place of occurrence of the external cause: Secondary | ICD-10-CM | POA: Insufficient documentation

## 2014-04-08 DIAGNOSIS — Z79899 Other long term (current) drug therapy: Secondary | ICD-10-CM | POA: Insufficient documentation

## 2014-04-08 DIAGNOSIS — J45909 Unspecified asthma, uncomplicated: Secondary | ICD-10-CM | POA: Insufficient documentation

## 2014-04-08 DIAGNOSIS — Z7982 Long term (current) use of aspirin: Secondary | ICD-10-CM | POA: Insufficient documentation

## 2014-04-08 DIAGNOSIS — R296 Repeated falls: Secondary | ICD-10-CM | POA: Insufficient documentation

## 2014-04-08 DIAGNOSIS — Z8739 Personal history of other diseases of the musculoskeletal system and connective tissue: Secondary | ICD-10-CM | POA: Insufficient documentation

## 2014-04-08 DIAGNOSIS — IMO0002 Reserved for concepts with insufficient information to code with codable children: Secondary | ICD-10-CM | POA: Insufficient documentation

## 2014-04-08 DIAGNOSIS — Y9389 Activity, other specified: Secondary | ICD-10-CM | POA: Insufficient documentation

## 2014-04-08 DIAGNOSIS — R259 Unspecified abnormal involuntary movements: Secondary | ICD-10-CM | POA: Insufficient documentation

## 2014-04-08 DIAGNOSIS — Z792 Long term (current) use of antibiotics: Secondary | ICD-10-CM | POA: Insufficient documentation

## 2014-04-08 DIAGNOSIS — Z87448 Personal history of other diseases of urinary system: Secondary | ICD-10-CM | POA: Insufficient documentation

## 2014-04-08 DIAGNOSIS — S0091XA Abrasion of unspecified part of head, initial encounter: Secondary | ICD-10-CM

## 2014-04-08 DIAGNOSIS — Z8679 Personal history of other diseases of the circulatory system: Secondary | ICD-10-CM | POA: Insufficient documentation

## 2014-04-08 DIAGNOSIS — E7800 Pure hypercholesterolemia, unspecified: Secondary | ICD-10-CM | POA: Insufficient documentation

## 2014-04-08 NOTE — ED Notes (Signed)
Per ems pt is from [facility]. Pt was in chair, tried to get up with his walker, walker went forward, so pt fell forward, abrasion to left eyebrow. No LOC. Denies pain.

## 2014-04-08 NOTE — ED Notes (Signed)
Bed: WA19 Expected date:  Expected time:  Means of arrival:  Comments: 

## 2014-04-08 NOTE — ED Provider Notes (Signed)
CSN: [id number]     Arrival date & time 04/08/14  1028 History   First MD Initiated Contact with Patient 04/08/14 1033     Chief Complaint  Patient presents with  . Fall     (Consider location/radiation/quality/duration/timing/severity/associated sxs/prior Treatment) Patient is a 73 y.o. male presenting with fall. The history is provided by the patient.  Fall   patient here complaining of pain to his left temporal area after suffering a mechanical fall while using his walker today. No loss of consciousness. Denies any neck pain. Does have an abrasion to his left side of his forehead. He does not take any blood thinners. Denies any hip or back pain. No chest or abdominal pain. No new deficits his upper extremities. Patient characterizes burning. No treatment used prior to arrival. Transported here by EMS.  Past Medical History  Diagnosis Date  . Scoliosis   . Heart disease   . Hypertrophy (benign) of prostate   . Asthma   . Hypercholesteremia   . Depression    Past Surgical History  Procedure Laterality Date  . Refractive surgery Bilateral     2 on OD, 1 on OS  . Mitral valve repair N/A    History reviewed. No pertinent family history. History  Substance Use Topics  . Smoking status: Never Smoker   . Smokeless tobacco: Not on file  . Alcohol Use: No    Review of Systems  All other systems reviewed and are negative.     Allergies  Aricept; Codeine; Exelon; Namenda; and Diphenhydramine  Home Medications   Prior to Admission medications   Medication Sig Start Date End Date Taking? Authorizing Provider  ALPRAZolam Duanne Moron) 0.5 MG tablet Take 0.25 mg by mouth 2 (two) times daily.    Historical Provider, MD  aspirin 81 MG tablet Take 81 mg by mouth daily.    Historical Provider, MD  calcium-vitamin D (OSCAL WITH D) 500-200 MG-UNIT per tablet Take 1 tablet by mouth daily.    Historical Provider, MD  Carbidopa-Levodopa ER (RYTARY) 36.25-145 MG CPCR Take 36.25 mg by mouth 3  (three) times daily. Take 3 capsules three times daily 03/22/14   Hulen Luster, DO  cholecalciferol (VITAMIN D) 1000 UNITS tablet Take 1,000 Units by mouth daily.    Historical Provider, MD  clobetasol cream (TEMOVATE) 7.51 % Apply 1 application topically daily.  06/15/13   Historical Provider, MD  clonazePAM (KLONOPIN) 0.5 MG tablet TAKE 1 & 1/2 TABLETS BY MOUTH AT BEDTIME 12/24/13   Hulen Luster, DO  fludrocortisone (FLORINEF) 0.1 MG tablet Take 0.2 mg by mouth daily.    Historical Provider, MD  gabapentin (NEURONTIN) 400 MG capsule Take 400 mg by mouth 2 (two) times daily.    Historical Provider, MD  latanoprost (XALATAN) 0.005 % ophthalmic solution Place 1 drop into both eyes every other day.  12/09/12   Historical Provider, MD  minocycline (MINOCIN,DYNACIN) 100 MG capsule Take 100 mg by mouth 2 (two) times daily.  01/25/13   Historical Provider, MD  MYRBETRIQ 25 MG TB24 tablet Take 25 mg by mouth every evening.  02/18/14   Historical Provider, MD  oxybutynin (DITROPAN-XL) 10 MG 24 hr tablet Take 10 mg by mouth every morning.    Historical Provider, MD  potassium chloride 20 MEQ/15ML (10%) SOLN Take 15 mEq by mouth daily.    Historical Provider, MD  PROAIR HFA 108 (90 BASE) MCG/ACT inhaler  07/09/13   Historical Provider, MD  sertraline (ZOLOFT) 100 MG tablet Take  100 mg by mouth 2 (two) times daily.     Historical Provider, MD  simvastatin (ZOCOR) 20 MG tablet Take 20 mg by mouth every evening.    Historical Provider, MD  traMADol (ULTRAM) 50 MG tablet Take 50 mg by mouth every 6 (six) hours as needed for pain.    Historical Provider, MD  Travoprost, BAK Free, (TRAVATAN) 0.004 % SOLN ophthalmic solution Place 1 drop into both eyes at bedtime.    Historical Provider, MD   BP 137/76  Pulse 64  Temp(Src) 97.5 F (36.4 C) (Oral)  Resp 16  SpO2 99% Physical Exam  Nursing note and vitals reviewed. Constitutional: He is oriented to person, place, and time. He appears well-developed and  well-nourished.  Non-toxic appearance. No distress.  HENT:  Head: Normocephalic and atraumatic.    Eyes: Conjunctivae, EOM and lids are normal. Pupils are equal, round, and reactive to light.  Neck: Normal range of motion. Neck supple. No spinous process tenderness and no muscular tenderness present. No tracheal deviation present. No mass present.  Cardiovascular: Normal rate, regular rhythm and normal heart sounds.  Exam reveals no gallop.   No murmur heard. Pulmonary/Chest: Effort normal and breath sounds normal. No stridor. No respiratory distress. He has no decreased breath sounds. He has no wheezes. He has no rhonchi. He has no rales.  Abdominal: Soft. Normal appearance and bowel sounds are normal. He exhibits no distension. There is no tenderness. There is no rebound and no CVA tenderness.  Musculoskeletal: Normal range of motion. He exhibits no edema and no tenderness.  Neurological: He is alert and oriented to person, place, and time. He has normal strength. He displays tremor. No cranial nerve deficit or sensory deficit. GCS eye subscore is 4. GCS verbal subscore is 5. GCS motor subscore is 6.  Skin: Skin is warm and dry. No abrasion and no rash noted.  Psychiatric: He has a normal mood and affect. His speech is normal and behavior is normal.    ED Course  Procedures (including critical care time) Labs Review Labs Reviewed - No data to display  Imaging Review Ct Head Wo Contrast  04/08/2014   CLINICAL DATA:  Head trauma status post fall.  EXAM: CT HEAD WITHOUT CONTRAST  TECHNIQUE: Contiguous axial images were obtained from the base of the skull through the vertex without intravenous contrast.  COMPARISON:  None.  FINDINGS: There is mild age appropriate diffuse cerebral atrophy. The ventricles are normal in size and position. There is decreased density in the deep white matter of both cerebral hemispheres consistent with chronic small vessel ischemia. There is no acute intracranial  hemorrhage nor evidence of acute ischemic change. The cerebellum and brainstem are normal in density.  There is no acute skull fracture nor cephalohematoma. The observed portions of the orbits and paranasal sinus are normal.  IMPRESSION: 1. There is no acute intracranial abnormality. Mild age related changes are present. 2. There is no acute skull fracture.   Electronically Signed   By: David  Martinique   On: 04/08/2014 11:40     EKG Interpretation None      MDM   Final diagnoses:  None    Patient's wound clean and dressed by nursing. Stable for discharge    Leota Jacobsen, MD 04/08/14 1237

## 2014-04-08 NOTE — Discharge Instructions (Signed)
Head Injury, Adult °You have received a head injury. It does not appear serious at this time. Headaches and vomiting are common following head injury. It should be easy to awaken from sleeping. Sometimes it is necessary for you to stay in the emergency department for a while for observation. Sometimes admission to the hospital may be needed. After injuries such as yours, most problems occur within the first 24 hours, but side effects may occur up to 7 10 days after the injury. It is important for you to carefully monitor your condition and contact your health care provider or seek immediate medical care if there is a change in your condition. °WHAT ARE THE TYPES OF HEAD INJURIES? °Head injuries can be as minor as a bump. Some head injuries can be more severe. More severe head injuries include: °· A jarring injury to the brain (concussion). °· A bruise of the brain (contusion). This mean there is bleeding in the brain that can cause swelling. °· A cracked skull (skull fracture). °· Bleeding in the brain that collects, clots, and forms a bump (hematoma). °WHAT CAUSES A HEAD INJURY? °A serious head injury is most likely to happen to someone who is in a car wreck and is not wearing a seat belt. Other causes of major head injuries include bicycle or motorcycle accidents, sports injuries, and falls. °HOW ARE HEAD INJURIES DIAGNOSED? °A complete history of the event leading to the injury and your current symptoms will be helpful in diagnosing head injuries. Many times, pictures of the brain, such as CT or MRI are needed to see the extent of the injury. Often, an overnight hospital stay is necessary for observation.  °WHEN SHOULD I SEEK IMMEDIATE MEDICAL CARE?  °You should get help right away if: °· You have confusion or drowsiness. °· You feel sick to your stomach (nauseous) or have continued, forceful vomiting. °· You have dizziness or unsteadiness that is getting worse. °· You have severe, continued headaches not  relieved by medicine. Only take over-the-counter or prescription medicines for pain, fever, or discomfort as directed by your health care provider. °· You do not have normal function of the arms or legs or are unable to walk. °· You notice changes in the black spots in the center of the colored part of your eye (pupil). °· You have a clear or bloody fluid coming from your nose or ears. °· You have a loss of vision. °During the next 24 hours after the injury, you must stay with someone who can watch you for the warning signs. This person should contact local emergency services (911 in the [place] if you have seizures, you become unconscious, or you are unable to wake up. °HOW CAN I PREVENT A HEAD INJURY IN THE FUTURE? °The most important factor for preventing major head injuries is avoiding motor vehicle accidents.  To minimize the potential for damage to your head, it is crucial to wear seat belts while riding in motor vehicles. Wearing helmets while bike riding and playing collision sports (like football) is also helpful. Also, avoiding dangerous activities around the house will further help reduce your risk of head injury.  °WHEN CAN I RETURN TO NORMAL ACTIVITIES AND ATHLETICS? °You should be reevaluated by your health care provider before returning to these activities. If you have any of the following symptoms, you should not return to activities or contact sports until 1 week after the symptoms have stopped: °· Persistent headache. °· Dizziness or vertigo. °· Poor attention and concentration. °·   Confusion.  Memory problems.  Nausea or vomiting.  Fatigue or tire easily.  Irritability.  Intolerant of bright lights or loud noises.  Anxiety or depression.  Disturbed sleep. MAKE SURE YOU:   Understand these instructions.  Will watch your condition.  Will get help right away if you are not doing well or get worse. Document Released: 10/11/2005 Document Revised: 08/01/2013 Document Reviewed:  06/18/2013 [facility] Patient Information 2014 [place].  Abrasions An abrasion is a cut or scrape of the skin. Abrasions do not go through all layers of the skin. HOME CARE  If a bandage (dressing) was put on your wound, change it as told by your doctor. If the bandage sticks, soak it off with warm.  Wash the area with water and soap 2 times a day. Rinse off the soap. Pat the area dry with a clean towel.  Put on medicated cream (ointment) as told by your doctor.  Change your bandage right away if it gets wet or dirty.  Only take medicine as told by your doctor.  See your doctor within 24 48 hours to get your wound checked.  Check your wound for redness, puffiness (swelling), or yellowish-white fluid (pus). GET HELP RIGHT AWAY IF:   You have more pain in the wound.  You have redness, swelling, or tenderness around the wound.  You have pus coming from the wound.  You have a fever or lasting symptoms for more than 2 3 days.  You have a fever and your symptoms suddenly get worse.  You have a bad smell coming from the wound or bandage. MAKE SURE YOU:   Understand these instructions.  Will watch your condition.  Will get help right away if you are not doing well or get worse. Document Released: 03/29/2008 Document Revised: 07/05/2012 Document Reviewed: 09/14/2011 [facility] Patient Information 2014 [place], [place].

## 2014-04-08 NOTE — ED Notes (Signed)
Gave patient a cup of water per MD Zenia Resides

## 2014-04-14 ENCOUNTER — Emergency Department (HOSPITAL_COMMUNITY)
Admission: EM | Admit: 2014-04-14 | Discharge: 2014-04-14 | Disposition: A | Discharge status: Home / Self Care | Payer: Medicare Other | Attending: Emergency Medicine | Admitting: Emergency Medicine

## 2014-04-14 ENCOUNTER — Emergency Department (HOSPITAL_COMMUNITY): Payer: Medicare Other

## 2014-04-14 ENCOUNTER — Encounter (HOSPITAL_COMMUNITY): Payer: Self-pay | Admitting: Emergency Medicine

## 2014-04-14 DIAGNOSIS — W19XXXA Unspecified fall, initial encounter: Secondary | ICD-10-CM

## 2014-04-14 DIAGNOSIS — Z79899 Other long term (current) drug therapy: Secondary | ICD-10-CM | POA: Insufficient documentation

## 2014-04-14 DIAGNOSIS — Y9289 Other specified places as the place of occurrence of the external cause: Secondary | ICD-10-CM | POA: Insufficient documentation

## 2014-04-14 DIAGNOSIS — Y939 Activity, unspecified: Secondary | ICD-10-CM | POA: Insufficient documentation

## 2014-04-14 DIAGNOSIS — Z7982 Long term (current) use of aspirin: Secondary | ICD-10-CM | POA: Insufficient documentation

## 2014-04-14 DIAGNOSIS — R296 Repeated falls: Secondary | ICD-10-CM | POA: Insufficient documentation

## 2014-04-14 DIAGNOSIS — Z8739 Personal history of other diseases of the musculoskeletal system and connective tissue: Secondary | ICD-10-CM | POA: Insufficient documentation

## 2014-04-14 DIAGNOSIS — E7800 Pure hypercholesterolemia, unspecified: Secondary | ICD-10-CM | POA: Insufficient documentation

## 2014-04-14 DIAGNOSIS — H409 Unspecified glaucoma: Secondary | ICD-10-CM | POA: Insufficient documentation

## 2014-04-14 DIAGNOSIS — IMO0002 Reserved for concepts with insufficient information to code with codable children: Secondary | ICD-10-CM | POA: Insufficient documentation

## 2014-04-14 DIAGNOSIS — J45909 Unspecified asthma, uncomplicated: Secondary | ICD-10-CM | POA: Insufficient documentation

## 2014-04-14 DIAGNOSIS — Z87448 Personal history of other diseases of urinary system: Secondary | ICD-10-CM | POA: Insufficient documentation

## 2014-04-14 DIAGNOSIS — G20 Parkinson's disease: Secondary | ICD-10-CM | POA: Insufficient documentation

## 2014-04-14 DIAGNOSIS — F329 Major depressive disorder, single episode, unspecified: Secondary | ICD-10-CM | POA: Insufficient documentation

## 2014-04-14 DIAGNOSIS — F3289 Other specified depressive episodes: Secondary | ICD-10-CM | POA: Insufficient documentation

## 2014-04-14 DIAGNOSIS — F0390 Unspecified dementia without behavioral disturbance: Secondary | ICD-10-CM | POA: Insufficient documentation

## 2014-04-14 DIAGNOSIS — G20A1 Parkinson's disease without dyskinesia, without mention of fluctuations: Secondary | ICD-10-CM | POA: Insufficient documentation

## 2014-04-14 DIAGNOSIS — S0990XA Unspecified injury of head, initial encounter: Secondary | ICD-10-CM | POA: Insufficient documentation

## 2014-04-14 NOTE — ED Notes (Signed)
Bed: EX51 Expected date:  Expected time:  Means of arrival:  Comments: EMS fall/[facility]

## 2014-04-14 NOTE — ED Provider Notes (Signed)
CSN: [id number]     Arrival date & time 04/14/14  1646 History   First MD Initiated Contact with Patient 04/14/14 1648     Chief Complaint  Patient presents with  . Fall     (Consider location/radiation/quality/duration/timing/severity/associated sxs/prior Treatment) HPI 73 year old male with a history of dementia and Parkinson's presents after a fall. As follows unwitnessed. The patient has been having more frequent falls recently. The wife relates that the patient's been getting dizzy with standing. She relates this to his Parkinson's disease. He is not confused from his baseline, although he does get agitated intermittently. The patient does not want any testing done and remarks that people are pulling of parenchyma on him by sending him to the ER. The wife states that he might have a little mild swelling over his right for head that is new. He has a small abrasion to his left for head that happen on a fall 2 days ago. She states that the left eye has been injected for a long time and has a history of glaucoma. This is not new.  Past Medical History  Diagnosis Date  . Scoliosis   . Heart disease   . Hypertrophy (benign) of prostate   . Asthma   . Hypercholesteremia   . Depression    Past Surgical History  Procedure Laterality Date  . Refractive surgery Bilateral     2 on OD, 1 on OS  . Mitral valve repair N/A    History reviewed. No pertinent family history. History  Substance Use Topics  . Smoking status: Never Smoker   . Smokeless tobacco: Not on file  . Alcohol Use: No    Review of Systems  Unable to perform ROS: Dementia      Allergies  Aricept; Codeine; Exelon; Namenda; and Diphenhydramine  Home Medications   Prior to Admission medications   Medication Sig Start Date End Date Taking? Authorizing Provider  ALPRAZolam Duanne Moron) 0.5 MG tablet Take 0.25 mg by mouth 2 (two) times daily.    Historical Provider, MD  aspirin EC 81 MG tablet Take 81 mg by mouth daily.     Historical Provider, MD  Calcium 500 MG CHEW Chew 1 tablet by mouth daily with breakfast.    Historical Provider, MD  carbidopa-levodopa (SINEMET IR) 25-100 MG per tablet Take 2 tablets by mouth 4 (four) times daily. Takes at 2703,5009, 1600, and 2000    Historical Provider, MD  cholecalciferol (VITAMIN D) 1000 UNITS tablet Take 2,000 Units by mouth daily.     Historical Provider, MD  clobetasol cream (TEMOVATE) 3.81 % Apply 1 application topically 2 (two) times daily.  06/15/13   Historical Provider, MD  clonazePAM (KLONOPIN) 0.5 MG tablet Take 0.75 mg by mouth daily.    Historical Provider, MD  fludrocortisone (FLORINEF) 0.1 MG tablet Take 0.2 mg by mouth daily.    Historical Provider, MD  gabapentin (NEURONTIN) 400 MG capsule Take 400 mg by mouth 2 (two) times daily.    Historical Provider, MD  latanoprost (XALATAN) 0.005 % ophthalmic solution Place 1 drop into both eyes every other day.  12/09/12   Historical Provider, MD  minocycline (MINOCIN,DYNACIN) 100 MG capsule Take 100 mg by mouth every 12 (twelve) hours.  01/25/13   Historical Provider, MD  MYRBETRIQ 25 MG TB24 tablet Take 25 mg by mouth every evening.  02/18/14   Historical Provider, MD  oxybutynin (DITROPAN-XL) 10 MG 24 hr tablet Take 10 mg by mouth every morning.    Historical Provider,  MD  phenylephrine-shark liver oil-mineral oil-petrolatum (PREPARATION H) 0.25-3-14-71.9 % rectal ointment Place 1 application rectally 3 (three) times daily as needed for hemorrhoids.    Historical Provider, MD  potassium chloride 20 MEQ/15ML (10%) SOLN Take 10 mEq by mouth daily.     Historical Provider, MD  PROAIR HFA 108 (90 BASE) MCG/ACT inhaler Inhale 2 puffs into the lungs every 4 (four) hours as needed.  07/09/13   Historical Provider, MD  sertraline (ZOLOFT) 100 MG tablet Take 100 mg by mouth daily.     Historical Provider, MD  simvastatin (ZOCOR) 20 MG tablet Take 20 mg by mouth at bedtime.     Historical Provider, MD  traMADol (ULTRAM) 50 MG  tablet Take 50 mg by mouth every 6 (six) hours as needed for pain.    Historical Provider, MD  Travoprost, BAK Free, (TRAVATAN) 0.004 % SOLN ophthalmic solution Place 1 drop into both eyes at bedtime.    Historical Provider, MD   BP 161/96  Pulse 93  Temp(Src) 97.8 F (36.6 C) (Oral)  Resp 18  SpO2 96% Physical Exam  Nursing note and vitals reviewed. Constitutional: He appears well-developed and well-nourished.  HENT:  Head: Normocephalic.  Right Ear: External ear normal.  Left Ear: External ear normal.  Nose: Nose normal.  Small abrasion over left temple  Eyes: Right eye exhibits no discharge. Left eye exhibits no discharge.  Injected left conjunctiva  Neck: Neck supple.  Cardiovascular: Normal rate, regular rhythm, normal heart sounds and intact distal pulses.   Pulmonary/Chest: Effort normal and breath sounds normal.  Abdominal: Soft. He exhibits no distension. There is no tenderness.  Musculoskeletal: He exhibits no edema.  Neurological: He is alert. He is disoriented.  Skin: Skin is warm and dry.    ED Course  Procedures (including critical care time) Labs Review Labs Reviewed - No data to display  Imaging Review Ct Head Wo Contrast  04/14/2014   CLINICAL DATA:  Patient fell in bathroom.  Combative, confused  EXAM: CT HEAD WITHOUT CONTRAST  TECHNIQUE: Contiguous axial images were obtained from the base of the skull through the vertex without intravenous contrast.  COMPARISON:  None.  FINDINGS: There is no evidence of mass effect, midline shift, or extra-axial fluid collections. There is no evidence of a space-occupying lesion or intracranial hemorrhage. There is no evidence of a cortical-based area of acute infarction. There is periventricular white matter low attenuation likely secondary to microangiopathy.  The ventricles and sulci are appropriate for the patient's age. The basal cisterns are patent.  Visualized portions of the orbits are unremarkable. The visualized  portions of the paranasal sinuses and mastoid air cells are unremarkable.  The osseous structures are unremarkable.  IMPRESSION: No acute intracranial pathology.   Electronically Signed   By: Kathreen Devoid   On: 04/14/2014 18:44     EKG Interpretation None      MDM   Final diagnoses:  Fall, initial encounter    Patient is initially mildly combative and does not want [place] to do any tests. When his wife arrived she loudest to the CT scan and he agreed. This performed as the patient was found lying on the floor with his head against a wall. There is no evidence of new trauma on exam. White endorses that he's been getting dizzy with standing, and she attributes this to his Parkinson's. I discussed that there are other possibilities, such as anemia, cardiac issues, dehydration, or other possible pathologies that could be causing the symptoms.  However she does not agree to getting any blood tests. She feels like he is at his baseline and was taken to be returned to the nursing facility and she will follow up with his doctor there in the next day or 2. After multiple times tried to explain well is concerned about she still prefers to go home. At this time he is at his mental baseline, his vital signs are stable, and he is stable for discharge. Advised to return if any symptoms worsen or return.    Ephraim Hamburger, MD 04/15/14 [phone]

## 2014-04-14 NOTE — ED Notes (Signed)
[facility] called for transport.  

## 2014-04-14 NOTE — ED Notes (Signed)
Per EMS pt fell in bathroom; pt's baseline is confused and combative at times.

## 2014-04-15 ENCOUNTER — Telehealth: Payer: Self-pay | Admitting: Neurology

## 2014-04-15 NOTE — Telephone Encounter (Signed)
She has some questions about one of the patients medication that she would like to discuss with the providers Nurse

## 2014-04-15 NOTE — Telephone Encounter (Addendum)
Tonja with [facility] calling [phone])   Dr. Anthoney Harada (geriatric consultant) is requesting to speak to Dr. Janann Colonel  [phone]).  Relayed not in office today.  This is regarding his PD meds, and his increase amount of falls.

## 2014-04-16 ENCOUNTER — Encounter: Payer: Self-pay | Admitting: *Deleted

## 2014-04-16 NOTE — Telephone Encounter (Signed)
I called and spoke to Dr. Dillard Essex.  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  She increased florinef to 0.2 to 0.3mg  in am, PT for use with walker.  Tapering off gabapentin and alprazolam at this time afer speaking with psych, klonopin would be next.  She asked about what medications tried instead of sinemet, and rytary? Please call the [phone] Dr. Dillard Essex.

## 2014-04-16 NOTE — Telephone Encounter (Signed)
Joen Laura with [facility] @ [phone] requesting Butch Penny to return call.

## 2014-04-17 ENCOUNTER — Telehealth: Payer: Self-pay | Admitting: Neurology

## 2014-04-17 NOTE — Telephone Encounter (Signed)
LMVM for pts wife to return call for appt with Daun Peacock, NP aprox 05-15-14 (1130 or 1430 appt were available).

## 2014-04-17 NOTE — Telephone Encounter (Signed)
Discussed case with Dr. Dillard Essex. She will try to titrate up the Florinef to a max of 0.5mg . If no improvement then we will consider starting Northera. Will also try to taper down the Sinemet to 1.5 tabs QID. Plan to have patient return in 6 weeks.

## 2014-04-17 NOTE — Telephone Encounter (Signed)
Call returned and case discussed with Dr Dillard Essex (please see other phone note for further notes).   Lovey Newcomer, can you please call Ms Pudlo and schedule a follow up visit for 6 weeks with Cecille Rubin. Thanks.

## 2014-04-18 NOTE — Telephone Encounter (Signed)
I called and spoke to Mrs. [name] [name].  Appt made 05-15-14 at 1130, be here 1115.  She will call back if problem with facility's transportation.

## 2014-04-23 ENCOUNTER — Emergency Department (HOSPITAL_COMMUNITY)
Admission: EM | Admit: 2014-04-23 | Discharge: 2014-04-23 | Disposition: A | Discharge status: Home / Self Care | Payer: Medicare Other | Attending: Emergency Medicine | Admitting: Emergency Medicine

## 2014-04-23 ENCOUNTER — Encounter (HOSPITAL_COMMUNITY): Payer: Self-pay | Admitting: Emergency Medicine

## 2014-04-23 DIAGNOSIS — F3289 Other specified depressive episodes: Secondary | ICD-10-CM | POA: Insufficient documentation

## 2014-04-23 DIAGNOSIS — J45909 Unspecified asthma, uncomplicated: Secondary | ICD-10-CM | POA: Insufficient documentation

## 2014-04-23 DIAGNOSIS — Y9389 Activity, other specified: Secondary | ICD-10-CM | POA: Insufficient documentation

## 2014-04-23 DIAGNOSIS — S0181XA Laceration without foreign body of other part of head, initial encounter: Secondary | ICD-10-CM

## 2014-04-23 DIAGNOSIS — F329 Major depressive disorder, single episode, unspecified: Secondary | ICD-10-CM | POA: Insufficient documentation

## 2014-04-23 DIAGNOSIS — Z8739 Personal history of other diseases of the musculoskeletal system and connective tissue: Secondary | ICD-10-CM | POA: Insufficient documentation

## 2014-04-23 DIAGNOSIS — N400 Enlarged prostate without lower urinary tract symptoms: Secondary | ICD-10-CM | POA: Insufficient documentation

## 2014-04-23 DIAGNOSIS — Y9289 Other specified places as the place of occurrence of the external cause: Secondary | ICD-10-CM | POA: Insufficient documentation

## 2014-04-23 DIAGNOSIS — I519 Heart disease, unspecified: Secondary | ICD-10-CM | POA: Insufficient documentation

## 2014-04-23 DIAGNOSIS — Z79899 Other long term (current) drug therapy: Secondary | ICD-10-CM | POA: Insufficient documentation

## 2014-04-23 DIAGNOSIS — IMO0002 Reserved for concepts with insufficient information to code with codable children: Secondary | ICD-10-CM | POA: Insufficient documentation

## 2014-04-23 DIAGNOSIS — E7800 Pure hypercholesterolemia, unspecified: Secondary | ICD-10-CM | POA: Insufficient documentation

## 2014-04-23 DIAGNOSIS — S0180XA Unspecified open wound of other part of head, initial encounter: Secondary | ICD-10-CM | POA: Insufficient documentation

## 2014-04-23 DIAGNOSIS — W010XXA Fall on same level from slipping, tripping and stumbling without subsequent striking against object, initial encounter: Secondary | ICD-10-CM | POA: Insufficient documentation

## 2014-04-23 DIAGNOSIS — Z7982 Long term (current) use of aspirin: Secondary | ICD-10-CM | POA: Insufficient documentation

## 2014-04-23 LAB — CBG MONITORING, ED: Glucose-Capillary: 128 mg/dL — ABNORMAL HIGH (ref 70–99)

## 2014-04-23 MED ORDER — ACETAMINOPHEN 325 MG PO TABS: 650.0000 mg | ORAL_TABLET | Freq: Four times a day (QID) | ORAL | Status: DC | PRN

## 2014-04-23 MED ORDER — TETANUS-DIPHTH-ACELL PERTUSSIS 5-2.5-18.5 LF-MCG/0.5 IM SUSP
0.5000 mL | Freq: Once | INTRAMUSCULAR | Status: DC
  Filled 2014-04-23: qty 0.5

## 2014-04-23 MED ORDER — ACETAMINOPHEN 325 MG PO TABS
650.0000 mg | ORAL_TABLET | Freq: Once | ORAL | Status: AC
  Administered 2014-04-23: 650 mg via ORAL
  Filled 2014-04-23: qty 2

## 2014-04-23 NOTE — Discharge Instructions (Signed)
Keep wound very clean. Have sutures removed, your doctor/nurse, in 1 week.  Strict fall precautions - use walker, and call for assistance when ambulating.  Return to ER if worse, new symptoms, severe pain, severe headache, infection of wound, change in mental status, fevers, other concern.    Facial Laceration  A facial laceration is a cut on the face. These injuries can be painful and cause bleeding. Lacerations usually heal quickly, but they need special care to reduce scarring. DIAGNOSIS  Your health care provider will take a medical history, ask for details about how the injury occurred, and examine the wound to determine how deep the cut is. TREATMENT  Some facial lacerations may not require closure. Others may not be able to be closed because of an increased risk of infection. The risk of infection and the chance for successful closure will depend on various factors, including the amount of time since the injury occurred. The wound may be cleaned to help prevent infection. If closure is appropriate, pain medicines may be given if needed. Your health care provider will use stitches (sutures), wound glue (adhesive), or skin adhesive strips to repair the laceration. These tools bring the skin edges together to allow for faster healing and a better cosmetic outcome. If needed, you may also be given a tetanus shot. HOME CARE INSTRUCTIONS  Only take over-the-counter or prescription medicines as directed by your health care provider.  Follow your health care provider's instructions for wound care. These instructions will vary depending on the technique used for closing the wound. For Sutures:  Keep the wound clean and dry.   If you were given a bandage (dressing), you should change it at least once a day. Also change the dressing if it becomes wet or dirty, or as directed by your health care provider.   Wash the wound with soap and water 2 times a day. Rinse the wound off with water to  remove all soap. Pat the wound dry with a clean towel.   After cleaning, apply a thin layer of the antibiotic ointment recommended by your health care provider. This will help prevent infection and keep the dressing from sticking.   You may shower as usual after the first 24 hours. Do not soak the wound in water until the sutures are removed.   Get your sutures removed as directed by your health care provider. With facial lacerations, sutures should usually be taken out after 4-5 days to avoid stitch marks.   Wait a few days after your sutures are removed before applying any makeup. For Skin Adhesive Strips:  Keep the wound clean and dry.   Do not get the skin adhesive strips wet. You may bathe carefully, using caution to keep the wound dry.   If the wound gets wet, pat it dry with a clean towel.   Skin adhesive strips will fall off on their own. You may trim the strips as the wound heals. Do not remove skin adhesive strips that are still stuck to the wound. They will fall off in time.  For Wound Adhesive:  You may briefly wet your wound in the shower or bath. Do not soak or scrub the wound. Do not swim. Avoid periods of heavy sweating until the skin adhesive has fallen off on its own. After showering or bathing, gently pat the wound dry with a clean towel.   Do not apply liquid medicine, cream medicine, ointment medicine, or makeup to your wound while the skin adhesive is  in place. This may loosen the film before your wound is healed.   If a dressing is placed over the wound, be careful not to apply tape directly over the skin adhesive. This may cause the adhesive to be pulled off before the wound is healed.   Avoid prolonged exposure to sunlight or tanning lamps while the skin adhesive is in place.  The skin adhesive will usually remain in place for 5-10 days, then naturally fall off the skin. Do not pick at the adhesive film.  After Healing: Once the wound has healed,  cover the wound with sunscreen during the day for 1 full year. This can help minimize scarring. Exposure to ultraviolet light in the first year will darken the scar. It can take 1-2 years for the scar to lose its redness and to heal completely.  SEEK IMMEDIATE MEDICAL CARE IF:  You have redness, pain, or swelling around the wound.   You see ayellowish-white fluid (pus) coming from the wound.   You have chills or a fever.  MAKE SURE YOU:  Understand these instructions.  Will watch your condition.  Will get help right away if you are not doing well or get worse. Document Released: 11/18/2004 Document Revised: 08/01/2013 Document Reviewed: 05/24/2013 [facility] Patient Information 2015 [place], [place]. This information is not intended to replace advice given to you by your health care provider. Make sure you discuss any questions you have with your health care provider.  Facial Laceration  A facial laceration is a cut on the face. These injuries can be painful and cause bleeding. Lacerations usually heal quickly, but they need special care to reduce scarring. DIAGNOSIS  Your health care provider will take a medical history, ask for details about how the injury occurred, and examine the wound to determine how deep the cut is. TREATMENT  Some facial lacerations may not require closure. Others may not be able to be closed because of an increased risk of infection. The risk of infection and the chance for successful closure will depend on various factors, including the amount of time since the injury occurred. The wound may be cleaned to help prevent infection. If closure is appropriate, pain medicines may be given if needed. Your health care provider will use stitches (sutures), wound glue (adhesive), or skin adhesive strips to repair the laceration. These tools bring the skin edges together to allow for faster healing and a better cosmetic outcome. If needed, you may also be given a tetanus  shot. HOME CARE INSTRUCTIONS  Only take over-the-counter or prescription medicines as directed by your health care provider.  Follow your health care provider's instructions for wound care. These instructions will vary depending on the technique used for closing the wound. For Sutures:  Keep the wound clean and dry.   If you were given a bandage (dressing), you should change it at least once a day. Also change the dressing if it becomes wet or dirty, or as directed by your health care provider.   Wash the wound with soap and water 2 times a day. Rinse the wound off with water to remove all soap. Pat the wound dry with a clean towel.   After cleaning, apply a thin layer of the antibiotic ointment recommended by your health care provider. This will help prevent infection and keep the dressing from sticking.   You may shower as usual after the first 24 hours. Do not soak the wound in water until the sutures are removed.   Get  your sutures removed as directed by your health care provider. With facial lacerations, sutures should usually be taken out after 4-5 days to avoid stitch marks.   Wait a few days after your sutures are removed before applying any makeup. For Skin Adhesive Strips:  Keep the wound clean and dry.   Do not get the skin adhesive strips wet. You may bathe carefully, using caution to keep the wound dry.   If the wound gets wet, pat it dry with a clean towel.   Skin adhesive strips will fall off on their own. You may trim the strips as the wound heals. Do not remove skin adhesive strips that are still stuck to the wound. They will fall off in time.  For Wound Adhesive:  You may briefly wet your wound in the shower or bath. Do not soak or scrub the wound. Do not swim. Avoid periods of heavy sweating until the skin adhesive has fallen off on its own. After showering or bathing, gently pat the wound dry with a clean towel.   Do not apply liquid medicine, cream  medicine, ointment medicine, or makeup to your wound while the skin adhesive is in place. This may loosen the film before your wound is healed.   If a dressing is placed over the wound, be careful not to apply tape directly over the skin adhesive. This may cause the adhesive to be pulled off before the wound is healed.   Avoid prolonged exposure to sunlight or tanning lamps while the skin adhesive is in place.  The skin adhesive will usually remain in place for 5-10 days, then naturally fall off the skin. Do not pick at the adhesive film.  After Healing: Once the wound has healed, cover the wound with sunscreen during the day for 1 full year. This can help minimize scarring. Exposure to ultraviolet light in the first year will darken the scar. It can take 1-2 years for the scar to lose its redness and to heal completely.  SEEK IMMEDIATE MEDICAL CARE IF:  You have redness, pain, or swelling around the wound.   You see ayellowish-white fluid (pus) coming from the wound.   You have chills or a fever.  MAKE SURE YOU:  Understand these instructions.  Will watch your condition.  Will get help right away if you are not doing well or get worse. Document Released: 11/18/2004 Document Revised: 08/01/2013 Document Reviewed: 05/24/2013 [facility] Patient Information 2015 [place], [place]. This information is not intended to replace advice given to you by your health care provider. Make sure you discuss any questions you have with your health care provider.   Fall Prevention and Home Safety Falls cause injuries and can affect all age groups. It is possible to use preventive measures to significantly decrease the likelihood of falls. There are many simple measures which can make your home safer and prevent falls. OUTDOORS  Repair cracks and edges of walkways and driveways.  Remove high doorway thresholds.  Trim shrubbery on the main path into your home.  Have good outside  lighting.  Clear walkways of tools, rocks, debris, and clutter.  Check that handrails are not broken and are securely fastened. Both sides of steps should have handrails.  Have leaves, snow, and ice cleared regularly.  Use sand or salt on walkways during winter months.  In the garage, clean up grease or oil spills. BATHROOM  Install night lights.  Install grab bars by the toilet and in the tub and shower.  Use non-skid mats or decals in the tub or shower.  Place a plastic non-slip stool in the shower to sit on, if needed.  Keep floors dry and clean up all water on the floor immediately.  Remove soap buildup in the tub or shower on a regular basis.  Secure bath mats with non-slip, double-sided rug tape.  Remove throw rugs and tripping hazards from the floors. BEDROOMS  Install night lights.  Make sure a bedside light is easy to reach.  Do not use oversized bedding.  Keep a telephone by your bedside.  Have a firm chair with side arms to use for getting dressed.  Remove throw rugs and tripping hazards from the floor. KITCHEN  Keep handles on pots and pans turned toward the center of the stove. Use back burners when possible.  Clean up spills quickly and allow time for drying.  Avoid walking on wet floors.  Avoid hot utensils and knives.  Position shelves so they are not too high or low.  Place commonly used objects within easy reach.  If necessary, use a sturdy step stool with a grab bar when reaching.  Keep electrical cables out of the way.  Do not use floor polish or wax that makes floors slippery. If you must use wax, use non-skid floor wax.  Remove throw rugs and tripping hazards from the floor. STAIRWAYS  Never leave objects on stairs.  Place handrails on both sides of stairways and use them. Fix any loose handrails. Make sure handrails on both sides of the stairways are as long as the stairs.  Check carpeting to make sure it is firmly attached  along stairs. Make repairs to worn or loose carpet promptly.  Avoid placing throw rugs at the top or bottom of stairways, or properly secure the rug with carpet tape to prevent slippage. Get rid of throw rugs, if possible.  Have an electrician put in a light switch at the top and bottom of the stairs. OTHER FALL PREVENTION TIPS  Wear low-heel or rubber-soled shoes that are supportive and fit well. Wear closed toe shoes.  When using a stepladder, make sure it is fully opened and both spreaders are firmly locked. Do not climb a closed stepladder.  Add color or contrast paint or tape to grab bars and handrails in your home. Place contrasting color strips on first and last steps.  Learn and use mobility aids as needed. Install an electrical emergency response system.  Turn on lights to avoid dark areas. Replace light bulbs that burn out immediately. Get light switches that glow.  Arrange furniture to create clear pathways. Keep furniture in the same place.  Firmly attach carpet with non-skid or double-sided tape.  Eliminate uneven floor surfaces.  Select a carpet pattern that does not visually hide the edge of steps.  Be aware of all pets. OTHER HOME SAFETY TIPS  Set the water temperature for 120 F (48.8 C).  Keep emergency numbers on or near the telephone.  Keep smoke detectors on every level of the home and near sleeping areas. Document Released: 10/01/2002 Document Revised: 04/11/2012 Document Reviewed: 12/31/2011 [facility] Patient Information 2015 [place], [place]. This information is not intended to replace advice given to you by your health care provider. Make sure you discuss any questions you have with your health care provider.    Head Injury You have received a head injury. It does not appear serious at this time. Headaches and vomiting are common following head injury. It should be easy  to awaken from sleeping. Sometimes it is necessary for you to stay in the emergency  department for a while for observation. Sometimes admission to the hospital may be needed. After injuries such as yours, most problems occur within the first 24 hours, but side effects may occur up to 7-10 days after the injury. It is important for you to carefully monitor your condition and contact your health care provider or seek immediate medical care if there is a change in your condition. WHAT ARE THE TYPES OF HEAD INJURIES? Head injuries can be as minor as a bump. Some head injuries can be more severe. More severe head injuries include: A jarring injury to the brain (concussion). A bruise of the brain (contusion). This mean there is bleeding in the brain that can cause swelling. A cracked skull (skull fracture). Bleeding in the brain that collects, clots, and forms a bump (hematoma). WHAT CAUSES A HEAD INJURY? A serious head injury is most likely to happen to someone who is in a car wreck and is not wearing a seat belt. Other causes of major head injuries include bicycle or motorcycle accidents, sports injuries, and falls. HOW ARE HEAD INJURIES DIAGNOSED? A complete history of the event leading to the injury and your current symptoms will be helpful in diagnosing head injuries. Many times, pictures of the brain, such as CT or MRI are needed to see the extent of the injury. Often, an overnight hospital stay is necessary for observation.  WHEN SHOULD I SEEK IMMEDIATE MEDICAL CARE?  You should get help right away if: You have confusion or drowsiness. You feel sick to your stomach (nauseous) or have continued, forceful vomiting. You have dizziness or unsteadiness that is getting worse. You have severe, continued headaches not relieved by medicine. Only take over-the-counter or prescription medicines for pain, fever, or discomfort as directed by your health care provider. You do not have normal function of the arms or legs or are unable to walk. You notice changes in the black spots in the center  of the colored part of your eye (pupil). You have a clear or bloody fluid coming from your nose or ears. You have a loss of vision. During the next 24 hours after the injury, you must stay with someone who can watch you for the warning signs. This person should contact local emergency services (911 in the [place] if you have seizures, you become unconscious, or you are unable to wake up. HOW CAN I PREVENT A HEAD INJURY IN THE FUTURE? The most important factor for preventing major head injuries is avoiding motor vehicle accidents. To minimize the potential for damage to your head, it is crucial to wear seat belts while riding in motor vehicles. Wearing helmets while bike riding and playing collision sports (like football) is also helpful. Also, avoiding dangerous activities around the house will further help reduce your risk of head injury.  WHEN CAN I RETURN TO NORMAL ACTIVITIES AND ATHLETICS? You should be reevaluated by your health care provider before returning to these activities. If you have any of the following symptoms, you should not return to activities or contact sports until 1 week after the symptoms have stopped: Persistent headache. Dizziness or vertigo. Poor attention and concentration. Confusion. Memory problems. Nausea or vomiting. Fatigue or tire easily. Irritability. Intolerant of bright lights or loud noises. Anxiety or depression. Disturbed sleep. MAKE SURE YOU:  Understand these instructions. Will watch your condition. Will get help right away if you are not doing well  or get worse. Document Released: 10/11/2005 Document Revised: 10/16/2013 Document Reviewed: 06/18/2013 [facility] Patient Information 2015 [place], [place]. This information is not intended to replace advice given to you by your health care provider. Make sure you discuss any questions you have with your health care provider.   Laceration Care, Adult A laceration is a cut or lesion that goes through all  layers of the skin and into the tissue just beneath the skin. TREATMENT  Some lacerations may not require closure. Some lacerations may not be able to be closed due to an increased risk of infection. It is important to see your caregiver as soon as possible after an injury to minimize the risk of infection and maximize the opportunity for successful closure. If closure is appropriate, pain medicines may be given, if needed. The wound will be cleaned to help prevent infection. Your caregiver will use stitches (sutures), staples, wound glue (adhesive), or skin adhesive strips to repair the laceration. These tools bring the skin edges together to allow for faster healing and a better cosmetic outcome. However, all wounds will heal with a scar. Once the wound has healed, scarring can be minimized by covering the wound with sunscreen during the day for 1 full year. HOME CARE INSTRUCTIONS  For sutures or staples:  Keep the wound clean and dry.  If you were given a bandage (dressing), you should change it at least once a day. Also, change the dressing if it becomes wet or dirty, or as directed by your caregiver.  Wash the wound with soap and water 2 times a day. Rinse the wound off with water to remove all soap. Pat the wound dry with a clean towel.  After cleaning, apply a thin layer of the antibiotic ointment as recommended by your caregiver. This will help prevent infection and keep the dressing from sticking.  You may shower as usual after the first 24 hours. Do not soak the wound in water until the sutures are removed.  Only take over-the-counter or prescription medicines for pain, discomfort, or fever as directed by your caregiver.  Get your sutures or staples removed as directed by your caregiver. For skin adhesive strips:  Keep the wound clean and dry.  Do not get the skin adhesive strips wet. You may bathe carefully, using caution to keep the wound dry.  If the wound gets wet, pat it dry  with a clean towel.  Skin adhesive strips will fall off on their own. You may trim the strips as the wound heals. Do not remove skin adhesive strips that are still stuck to the wound. They will fall off in time. For wound adhesive:  You may briefly wet your wound in the shower or bath. Do not soak or scrub the wound. Do not swim. Avoid periods of heavy perspiration until the skin adhesive has fallen off on its own. After showering or bathing, gently pat the wound dry with a clean towel.  Do not apply liquid medicine, cream medicine, or ointment medicine to your wound while the skin adhesive is in place. This may loosen the film before your wound is healed.  If a dressing is placed over the wound, be careful not to apply tape directly over the skin adhesive. This may cause the adhesive to be pulled off before the wound is healed.  Avoid prolonged exposure to sunlight or tanning lamps while the skin adhesive is in place. Exposure to ultraviolet light in the first year will darken the scar.  The  skin adhesive will usually remain in place for 5 to 10 days, then naturally fall off the skin. Do not pick at the adhesive film. You may need a tetanus shot if:  You cannot remember when you had your last tetanus shot.  You have never had a tetanus shot. If you get a tetanus shot, your arm may swell, get red, and feel warm to the touch. This is common and not a problem. If you need a tetanus shot and you choose not to have one, there is a rare chance of getting tetanus. Sickness from tetanus can be serious. SEEK MEDICAL CARE IF:   You have redness, swelling, or increasing pain in the wound.  You see a red line that goes away from the wound.  You have yellowish-white fluid (pus) coming from the wound.  You have a fever.  You notice a bad smell coming from the wound or dressing.  Your wound breaks open before or after sutures have been removed.  You notice something coming out of the wound such  as wood or glass.  Your wound is on your hand or foot and you cannot move a finger or toe. SEEK IMMEDIATE MEDICAL CARE IF:   Your pain is not controlled with prescribed medicine.  You have severe swelling around the wound causing pain and numbness or a change in color in your arm, hand, leg, or foot.  Your wound splits open and starts bleeding.  You have worsening numbness, weakness, or loss of function of any joint around or beyond the wound.  You develop painful lumps near the wound or on the skin anywhere on your body. MAKE SURE YOU:   Understand these instructions.  Will watch your condition.  Will get help right away if you are not doing well or get worse. Document Released: 10/11/2005 Document Revised: 01/03/2012 Document Reviewed: 04/06/2011 [facility] Patient Information 2015 [place], [place]. This information is not intended to replace advice given to you by your health care provider. Make sure you discuss any questions you have with your health care provider.

## 2014-04-23 NOTE — ED Provider Notes (Signed)
CSN: [id number]     Arrival date & time 04/23/14  8250 History   First MD Initiated Contact with Patient 04/23/14 [phone]     Chief Complaint  Patient presents with  . Fall  . Head Laceration     (Consider location/radiation/quality/duration/timing/severity/associated sxs/prior Treatment) Patient is a 73 y.o. male presenting with fall and scalp laceration. The history is provided by the patient.  Fall Pertinent negatives include no chest pain, no abdominal pain, no headaches and no shortness of breath.  Head Laceration Pertinent negatives include no chest pain, no abdominal pain, no headaches and no shortness of breath.  pt from [facility], hx Parkinsons and frequent falls, c/o fall.  Spouse states is supposed to get up with assistance and/or walker, but occasionally tries to get up on own.  This am pt indicates was going into bathroom on own.  Stumbled, fell forward, hit head, rim of glasses cut face/forehead. Denies any faintness or dizziness prior to fall. No loc. No headache. Denies neck or back pain. No new numbness/weakness. Denies eye pain or change in vision. Denies any other pain or injury, states recent health at baseline. Since fall, mental status has remained c/w baseline, pt awake and alert. Tetanus unknown.  No anticoag use.      Past Medical History  Diagnosis Date  . Scoliosis   . Heart disease   . Hypertrophy (benign) of prostate   . Asthma   . Hypercholesteremia   . Depression    Past Surgical History  Procedure Laterality Date  . Refractive surgery Bilateral     2 on OD, 1 on OS  . Mitral valve repair N/A   . Cardiac catheterization  2001   Family History  Problem Relation Age of Onset  . Depression Father   . CVA Mother    History  Substance Use Topics  . Smoking status: Never Smoker   . Smokeless tobacco: Not on file  . Alcohol Use: No    Review of Systems  Constitutional: Negative for fever.  HENT: Negative for nosebleeds.   Eyes: Negative for redness.   Respiratory: Negative for shortness of breath.   Cardiovascular: Negative for chest pain and palpitations.  Gastrointestinal: Negative for nausea, vomiting and abdominal pain.  Genitourinary: Negative for flank pain.  Musculoskeletal: Negative for back pain and neck pain.  Skin: Positive for wound.  Neurological: Negative for syncope, weakness, numbness and headaches.  Hematological: Does not bruise/bleed easily.  Psychiatric/Behavioral: Negative for confusion.      Allergies  Aricept; Codeine; Exelon; Namenda; and Diphenhydramine  Home Medications   Prior to Admission medications   Medication Sig Start Date End Date Taking? Authorizing Provider  albuterol (PROVENTIL HFA;VENTOLIN HFA) 108 (90 BASE) MCG/ACT inhaler Inhale 2 puffs into the lungs every 6 (six) hours as needed for wheezing or shortness of breath.    Historical Provider, MD  ALPRAZolam Duanne Moron) 0.5 MG tablet Take 0.25 mg by mouth every evening. Take 1 tab at 1600 every day.    Historical Provider, MD  ALPRAZolam Duanne Moron) 0.5 MG tablet Take 0.5 mg by mouth 2 (two) times daily as needed (agitation).    Historical Provider, MD  aspirin EC 81 MG tablet Take 81 mg by mouth every morning.     Historical Provider, MD  calcium carbonate (TUMS - DOSED IN MG ELEMENTAL CALCIUM) 500 MG chewable tablet Chew 1 tablet by mouth daily with breakfast.    Historical Provider, MD  cholecalciferol (VITAMIN D) 1000 UNITS tablet Take 2,000 Units by  mouth every morning.     Historical Provider, MD  clobetasol cream (TEMOVATE) 0.81 % Apply 1 application topically 2 (two) times daily.    Historical Provider, MD  clonazePAM (KLONOPIN) 0.5 MG tablet Take 0.75 mg by mouth every evening. Take 1.5 tabs at 2000 every evening.    Historical Provider, MD  fludrocortisone (FLORINEF) 0.1 MG tablet Take 0.2 mg by mouth every morning.     Historical Provider, MD  gabapentin (NEURONTIN) 400 MG capsule Take 400 mg by mouth at bedtime.     Historical Provider, MD   latanoprost (XALATAN) 0.005 % ophthalmic solution Place 1 drop into both eyes every other day.  12/09/12   Historical Provider, MD  minocycline (MINOCIN,DYNACIN) 100 MG capsule Take 100 mg by mouth 2 (two) times daily.  01/25/13   Historical Provider, MD  MYRBETRIQ 25 MG TB24 tablet Take 25 mg by mouth every evening.  02/18/14   Historical Provider, MD  sertraline (ZOLOFT) 100 MG tablet Take 100 mg by mouth every morning.     Historical Provider, MD  traMADol (ULTRAM) 50 MG tablet Take 50 mg by mouth every 6 (six) hours as needed for moderate pain.     Historical Provider, MD  Travoprost, BAK Free, (TRAVATAN) 0.004 % SOLN ophthalmic solution Place 1 drop into both eyes every evening.     Historical Provider, MD   BP 122/85  Pulse 81  Temp(Src) 98 F (36.7 C) (Oral)  Resp 16  SpO2 96% Physical Exam  Nursing note and vitals reviewed. Constitutional: He appears well-developed and well-nourished. No distress.  HENT:  Mouth/Throat: Oropharynx is clear and moist.  5-6 cm laceration to left forehead above left eyebrow, and left cheek. No fb seen or felt. Facial bones/orbits intact.  Teeth firmly intact. No malocclusion.   Eyes: Conjunctivae are normal. Pupils are equal, round, and reactive to light. Right eye exhibits no discharge. No scleral icterus.  No hyphema.   Neck: Normal range of motion. Neck supple. No tracheal deviation present.  Cardiovascular: Normal rate, regular rhythm, normal heart sounds and intact distal pulses.   Pulmonary/Chest: Effort normal and breath sounds normal. No accessory muscle usage. No respiratory distress. He exhibits no tenderness.  Abdominal: Soft. Bowel sounds are normal. He exhibits no distension. There is no tenderness.  No abd wall bruising or contusion  Musculoskeletal: Normal range of motion. He exhibits no edema and no tenderness.  Good rom bil ext, no pain or focal bony tenderness. CTLS spine, non tender, aligned, no step off.   Neurological: He is alert.   Alert, oriented. Motor intact bil. stre 5/5. sens intact.   Skin: Skin is warm and dry. He is not diaphoretic.  Psychiatric: He has a normal mood and affect.    ED Course  Procedures (including critical care time) Labs Review Labs Reviewed  CBG MONITORING, ED - Abnormal; Notable for the following:    Glucose-Capillary 128 (*)    All other components within normal limits    Date: 04/23/2014  Rate: 80  Rhythm: normal sinus rhythm  QRS Axis: normal  Intervals: normal  ST/T Wave abnormalities: normal  Conduction Disutrbances:none  Narrative Interpretation:   Old EKG Reviewed: unchanged    MDM  Reviewed nursing notes and prior charts for additional history.   LACERATION REPAIR Performed by: Mirna Mires Consent: Verbal consent obtained. Risks and benefits: risks, benefits and alternatives were discussed Patient identity confirmed: provided demographic data Time out performed prior to procedure Prepped and Draped in normal sterile fashion  Wound explored Laceration Location: left forehead and cheek Laceration Length: 5 cm No Foreign Bodies seen or palpated Anesthesia: local infiltration Local anesthetic: lidocaine 2% w epinephrine Anesthetic total: 5 ml Irrigation method: syringe Amount of cleaning: standard Skin closure: 6-0 prolene skin, 5-0 vicryl (2) Number of sutures or staples: 12 Technique: layered closure Patient tolerance: Patient tolerated the procedure well with no immediate complications.   Bacitracin to wound.  Spine non tender. No loc. Pt/family state mental status c/w baseline. No anticoag use.  2 recent neg head cts.  Tylenol po.  Tetanus unknown, tetanus im.  Pt appears stable for d/c.      Mirna Mires, MD 04/23/14 [phone]

## 2014-04-23 NOTE — ED Notes (Signed)
Bed: WA17 Expected date:  Expected time:  Means of arrival:  Comments: EMS- fall, head lac, no thinners

## 2014-04-23 NOTE — ED Notes (Signed)
Per EMS-pt from [facility] with c/o of fall this am. Hx Parkinson disease. Denies LOC. Head laceration to left face above eye.

## 2014-04-30 ENCOUNTER — Encounter: Payer: Self-pay | Admitting: Interventional Cardiology

## 2014-05-10 ENCOUNTER — Telehealth: Payer: Self-pay | Admitting: *Deleted

## 2014-05-10 NOTE — Telephone Encounter (Signed)
Spoke with patient's wife, informed her that his appointment needed to be r/s, offered the 3:30 slot on 05/16/14 with NP CM, states that patient lives at [facility] and she will get with them to r/s appointment, appointment for 05/15/14 has been cancelled.

## 2014-05-10 NOTE — Telephone Encounter (Signed)
Patient's wife returned the call appointment was r/s with Dr. Janann Colonel on 05/31/14 at 1:30 pm.

## 2014-05-13 ENCOUNTER — Telehealth: Payer: Self-pay | Admitting: *Deleted

## 2014-05-13 NOTE — Telephone Encounter (Signed)
I just canceled his appointment for Wednesday.  He's now at a memory unit at the [facility].  Too difficult to get him there to see Dr. Valentina Lucks.  Going to use Podiatrist at the facility.  Can you send information about [name] [name] to them?  We've enjoyed Dr. Valentina Lucks, hate to have to leave her!

## 2014-05-13 NOTE — Telephone Encounter (Signed)
I already cancelled his appointment.

## 2014-05-14 NOTE — Telephone Encounter (Signed)
Sounds good-- OK to send notes for him too.  Tell them I wish them the best-- hope he does well at the [facility] memory center.  Thanks!

## 2014-05-15 ENCOUNTER — Ambulatory Visit: Payer: Self-pay | Admitting: Nurse Practitioner

## 2014-05-16 ENCOUNTER — Ambulatory Visit: Payer: Medicare Other | Admitting: Podiatrist

## 2014-05-27 ENCOUNTER — Ambulatory Visit: Payer: Medicare Other | Admitting: Interventional Cardiology

## 2014-05-27 ENCOUNTER — Encounter: Payer: Self-pay | Admitting: Neurology

## 2014-05-30 NOTE — Telephone Encounter (Signed)
Noted  

## 2014-05-30 NOTE — Telephone Encounter (Signed)
Taken care of, faxed to the memory care center.

## 2014-05-31 ENCOUNTER — Ambulatory Visit: Payer: Self-pay | Admitting: Neurology

## 2014-06-18 ENCOUNTER — Ambulatory Visit: Payer: Self-pay | Admitting: Neurology

## 2014-06-21 ENCOUNTER — Ambulatory Visit (INDEPENDENT_AMBULATORY_CARE_PROVIDER_SITE_OTHER): Payer: Medicare Other | Admitting: Neurology

## 2014-06-21 ENCOUNTER — Encounter: Payer: Self-pay | Admitting: Neurology

## 2014-06-21 VITALS — BP 128/78 | HR 70 | Ht 69.0 in | Wt 147.0 lb

## 2014-06-21 DIAGNOSIS — G20 Parkinson's disease: Secondary | ICD-10-CM

## 2014-06-21 DIAGNOSIS — G20A1 Parkinson's disease without dyskinesia, without mention of fluctuations: Secondary | ICD-10-CM

## 2014-06-21 MED ORDER — CARBIDOPA-LEVODOPA 25-100 MG PO TABS: ORAL_TABLET | ORAL | Status: DC

## 2014-06-21 NOTE — Progress Notes (Signed)
Subjective:    Patient ID: [name] [name] is a 73 y.o. male with history of PD presenting for follow up visit. Last visit was 02/2014 at which time he was tried on Lyons. He was unable to tolerate and was transitioned back to Sinemet. Presents today feeling well overall. His main concern is continued motor fluctuations and wearing off. He notes that his Sinemet takes around 40 minutes to kick in and then he gets around 3 hours of benefit. No dyskinesias noted. Memory appears stable. No agitation or hallucination. Walks with assistance of walker, continues to have difficulty with falls. These are mainly associated with times he does not use his walker.   Per NH notes he is currently taking Sinemet 25-100 and Sinemt 10-100 one tablet each 4 times a day at 8am, noon, 4pm and 8pm. This regimen was started while patient was in the NH.      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 He is accompanied by his wife again today and presents for a sooner than scheduled appointment because of problems with his memory per wife. I last saw him on 05/08/2013, at which time his MMSE was 27/30, and his physical exam was stable, but I felt that his psychiatric illness including the OCD and his anxiety and depression played a big role in his presentation. I recommended tapering off Comtan by taking 1 pill once daily for 3 days, then stop. His wife was encouraged to give him the Xanax 0.25 mg once daily as needed. He has been on clonazepam. His Sinemet was to stay the same for now.  Per him, he has had worsening in his fine motor skills, he tries to walk with a cane which has helped. He takes 2 pills of Sinemet at 8 AM,  1/2 pill at 10 AM, 2 at 11 AM, 2 at 3 PM and 1/2 pill at 5pm, 2pills 7pm. . He saw Dr. [name] recently and was advised to add the 1/2 pill at 10 AM. He had PT in the past. He is taking Requip 1 mg at night for RLS. Neurontin helps with L hip pain, which has been chronic for 4 years. He tried the Exelon patch for 2 days, but felt bad, including nausea.   We talked about his driving and I strongly discouraged him from driving. His wife voiced major concerns with his cognitive abilities and worried about his OCD, and she reported friction between them. She does not feel comfortable leaving him alone as he has left the stove on with a pot of water boiling away. He has also left all burners on one time. She is in counseling because she is going through a lot of stress with being his primary caretaker. He was advised to continue psychiatric followup. I did not suggest formal cognitive testing again and he had an evaluation with Dr. Valentina Shaggy in the past.   He previously followed with Dr. Morene Antu. He has an underlying medical history of heart disease, asthma, hyperlipidemia, depression, scoliosis, detached retina. He has had intermittent confusion and swallowing problems. I first met him on 01/29/2013. His PD was diagnosed in 2004. He has had physical therapy. At the time of his first visit with me I felt that his physical exam was stable. He had quite  significant anxiety. He had compulsive behavior with dopamine agonists in the past. I reduced his comtan to one pill twice daily.  He was given samples for Exelon patch. In the past, he had SE with Aricept, Exelon and Galantamine, mostly in the form of nausea. He had not been on Exelon patch before. His wife was particularly worried about his cognitive decline. He feels very sleepy at 11 AM almost daily and takes a nap for at least an hour daily, after which he feels better. This happens 3-4 days of the week.  He is more "hyper", and more depressed between 6 and 8 PM.  His wife has had concerns with his cognitive abilities. He has become lost, he gets lost in the store, he has left all 4 burners on of the stove. His balance is not as good. He is not exercising as much.  He has had worsening mood swings, more negative at times, not as optimistic as he used to be. He has no sleep problems, no severe constipation. He is eating a lot of sweets.   His Past Medical History Is Significant For: Past Medical History  Diagnosis Date  . Scoliosis   . Heart disease   . Hypertrophy (benign) of prostate   . Asthma   . Hypercholesteremia   . Depression     His Past Surgical History Is Significant For: Past Surgical History  Procedure Laterality Date  . Refractive surgery Bilateral     2 on OD, 1 on OS  . Mitral valve repair N/A   . Cardiac catheterization  2001    His Family History Is Significant For: Family History  Problem Relation Age of Onset  . Depression Father   . CVA Mother     His Social History Is Significant For: History   Social History  . Marital Status: Married    Spouse Name: Arleen    Number of Children: 1  . Years of Education: college   Social History Main Topics  . Smoking status: Never Smoker   . Smokeless tobacco: Never Used  . Alcohol Use: No  . Drug Use: No  . Sexual Activity: None   Other Topics Concern  . None   Social History Narrative   Patient lives at home with his wife [name])   Patient is right handed    Education college   Caffeine consumption once daily          His Allergies Are:  Allergies  Allergen Reactions  . Aricept [Donepezil Hcl] Other (See Comments)    Confusion  . Codeine Other (See Comments)    Per MAR  . Exelon [Rivastigmine Tartrate] Other (See Comments)    unknown  . Namenda [Memantine Hcl] Other (See Comments)    Urinary incontinence   . Diphenhydramine Other (See Comments)    Unknown   :   His Current Medications Are:  Outpatient Encounter Prescriptions as of 06/21/2014   Medication Sig  . acetaminophen (TYLENOL) 325 MG tablet Take 2 tablets (650 mg total) by mouth every 6 (six) hours as needed.  Marland Kitchen albuterol (PROVENTIL HFA;VENTOLIN HFA) 108 (90 BASE) MCG/ACT inhaler Inhale 2 puffs into the lungs every 6 (six) hours as needed for wheezing or shortness of breath.  . ALPRAZolam (XANAX) 0.5 MG tablet Take 0.25 mg by mouth every evening. Take 1 tab at 1600 every day.  . ALPRAZolam (XANAX) 0.5 MG tablet Take 0.5 mg by mouth 2 (two) times daily as needed (agitation).  Marland Kitchen aspirin EC  81 MG tablet Take 81 mg by mouth every morning.   . calcium carbonate (TUMS - DOSED IN MG ELEMENTAL CALCIUM) 500 MG chewable tablet Chew 1 tablet by mouth daily with breakfast.  . carbidopa-levodopa (SINEMET IR) 10-100 MG per tablet Take 1 tablet by mouth 2 (two) times daily. Stop date noted as 04/25/2014  . Carbidopa-Levodopa ER 36.25-145 MG CPCR Take 3 tablets by mouth 3 (three) times daily.  . cholecalciferol (VITAMIN D) 1000 UNITS tablet Take 2,000 Units by mouth every morning.   . clobetasol cream (TEMOVATE) 1.88 % Apply 1 application topically 2 (two) times daily.  . clonazePAM (KLONOPIN) 0.5 MG tablet Take 0.75 mg by mouth every evening. Take 1.5 tabs at 2000 every evening.  . fludrocortisone (FLORINEF) 0.1 MG tablet Take 0.3 mg by mouth every morning.   . gabapentin (NEURONTIN) 400 MG capsule Take 400 mg by mouth at bedtime.   Marland Kitchen latanoprost (XALATAN) 0.005 % ophthalmic solution Place 1 drop into both eyes every other day.   . minocycline (MINOCIN,DYNACIN) 100 MG capsule Take 100 mg by mouth 2 (two) times daily.   . mirtazapine (REMERON) 15 MG tablet   . MYRBETRIQ 25 MG TB24 tablet Take 25 mg by mouth every evening.   Marland Kitchen oxybutynin (DITROPAN-XL) 10 MG 24 hr tablet Take 10 mg by mouth at bedtime.  . potassium chloride 20 MEQ/15ML (10%) SOLN Take 10 mEq by mouth daily.  . Pramox-PE-Glycerin-Petrolatum (PREPARATION H) 1-0.25-14.4-15 % CREA Place 1 application rectally 3 (three) times daily  with meals as needed (when hemorrhoids present).  . sertraline (ZOLOFT) 100 MG tablet Take 100 mg by mouth every morning.   . simvastatin (ZOCOR) 20 MG tablet Take 20 mg by mouth daily.  . traMADol (ULTRAM) 50 MG tablet Take 50 mg by mouth every 6 (six) hours as needed for moderate pain.   . Travoprost, BAK Free, (TRAVATAN) 0.004 % SOLN ophthalmic solution Place 1 drop into both eyes every evening.   : Review of Systems  HENT: Positive for hearing loss, rhinorrhea and trouble swallowing.   Genitourinary: Positive for difficulty urinating.  Musculoskeletal: Positive for arthralgias.  Skin: Positive for rash.  Neurological: Positive for speech difficulty and weakness.       Memory loss, restless leg  Psychiatric/Behavioral: Positive for confusion and dysphoric mood. The patient is nervous/anxious.     Objective:  Neurologic Exam  Physical Exam Physical Examination:   Filed Vitals:   06/21/14 1406  BP: 128/78  Pulse: 70     General Examination: The patient is a very pleasant 73 y.o. male in no acute distress.  HEENT: Normocephalic, atraumatic, pupils are equal, round and reactive to light and accommodation. Extraocular tracking shows mild saccadic breakdown without nystagmus noted. There is mild limitation to upper gaze. There is mild decrease in eye blink rate. Hearing is impaired with hearing aid in L ear. Face is symmetric with moderate facial masking and normal facial sensation. There is no lip, neck or jaw tremor. Neck is moderately rigid with intact passive ROM. There are no carotid bruits on auscultation. Oropharynx exam reveals mild mouth dryness.   Chest: is clear to auscultation without wheezing, rhonchi or crackles noted.  Heart: sounds are regular and normal without murmurs, rubs or gallops noted.   Abdomen: is soft, non-tender and non-distended with normal bowel sounds appreciated on auscultation.  Extremities: There is no pitting edema in the distal lower extremities  bilaterally. Pedal pulses are intact.  Skin: is warm and dry with no trophic  changes noted.  Musculoskeletal: exam reveals no obvious joint deformities, tenderness or joint swelling or erythema.  Neurologically:  Mental status: The patient is awake and alert, paying good  attention. He is able to to partially provide the history. His wife provides details. He is oriented to: person, place only. His memory, attention, language and knowledge are fair. There is no aphasia, agnosia, apraxia or anomia. There is a mild degree of bradyphrenia. Speech is mildly hypophonic with minimal dysarthria noted. Mood is slightly depressed and anxious and affect is blunted.   Cranial nerves are as described above under HEENT exam. In addition, shoulder shrug is normal with equal shoulder height noted.  Motor exam: Normal bulk, and strength for age is noted. Tone is mildly rigid with presence of cogwheeling in the right upper extremity. There is overall mild bradykinesia. There is no drift or rebound. There is no tremor.   Romberg is negative. Reflexes are 2+ in the upper extremities and 2+ in the lower extremities, but trace in the ankles.   Fine motor skills exam reveals: Finger taps are mildly to moderately impaired on the right and mildly impaired on the left. Hand movements are mildly impaired on the right and mildly impaired on the left. RAP (rapid alternating patting) is mildly impaired on the right and mildly impaired on the left. Foot taps are mildly to moderately impaired on the right and mildly impaired on the left. Foot agility (in the form of heel stomping) is mildly impaired on the right and mildly impaired on the left.    Cerebellar testing shows no dysmetria or intention tremor on finger to nose testing. There is no truncal or gait ataxia.   Sensory exam is intact to light touch, pinprick, vibration, temperature sense and proprioception in the upper and lower extremities.   Gait, station and balance  exan: He stands up from the seated position with minimal difficulty and needs no assistance. No veering to one side is noted. He is not noted to lean to the side. Posture is mildly stooped. Stance is wide-based. He walks with decrease in stride length but good pace and decreased arm swing on the right and left. He turns in 3 steps. Tandem walk is not possible. Balance is mildly impaired. He is not able to do a toe or heel stance.     Assessment and Plan:    1)PD 2)PDD 3)Anxiety/depression 4)orthostatic hypotension  In summary, Kolson Chovanec is a very pleasant 73 year old male with a history of advanced Parkinson's disease, of over 10 years duration, associated with memory loss, OCD, anxiety and depression. Overall he looks well today. His main concern today is wearing off of his Sinemet. I had a long chat with the patient and his wife again about his condition and the diagnosis of advanced PD, the prognosis and treatment options. We talked about medical treatments and non-pharmacological approaches. We talked about maintaining a healthy lifestyle in general. I encouraged patient to exercise more and use his walker consistently.   -will change Sinemet to 25-100 1.5 tablet 5x a day at 7am, 10am, 1pm, 4pm and 7pm -continue PT and use of walker for fall prevention -continue florinef for orthostatic hypotension. As this appears stable will not make any dose adjustments at this time -follow up in 4 months

## 2014-06-21 NOTE — Patient Instructions (Signed)
Notes provided on nursing home paperwork

## 2014-07-05 ENCOUNTER — Encounter: Payer: Self-pay | Admitting: Interventional Cardiology

## 2014-07-05 ENCOUNTER — Ambulatory Visit (INDEPENDENT_AMBULATORY_CARE_PROVIDER_SITE_OTHER): Payer: Medicare Other | Admitting: Interventional Cardiology

## 2014-07-05 VITALS — BP 142/90 | HR 59 | Ht 69.0 in | Wt 142.0 lb

## 2014-07-05 DIAGNOSIS — G20 Parkinson's disease: Secondary | ICD-10-CM

## 2014-07-05 DIAGNOSIS — I951 Orthostatic hypotension: Secondary | ICD-10-CM

## 2014-07-05 DIAGNOSIS — E785 Hyperlipidemia, unspecified: Secondary | ICD-10-CM

## 2014-07-05 DIAGNOSIS — G20A1 Parkinson's disease without dyskinesia, without mention of fluctuations: Secondary | ICD-10-CM

## 2014-07-05 DIAGNOSIS — I059 Rheumatic mitral valve disease, unspecified: Secondary | ICD-10-CM

## 2014-07-05 NOTE — Patient Instructions (Signed)
Your physician recommends that you continue on your current medications as directed. Please refer to the Current Medication list given to you today.  Your physician wants you to follow-up in: 1 year with Dr. Varanasi. You will receive a reminder letter in the mail two months in advance. If you don't receive a letter, please call our office to schedule the follow-up appointment.  

## 2014-07-05 NOTE — Progress Notes (Signed)
Patient ID: [name] [name], male   DOB: 10/23/1941, 73 y.o.   MRN: [id number]    [address], [place], [place]  [address] Phone: [phone] Fax:  [phone]  Date:  07/05/2014   ID:  Hagop Mccollam, DOB 11/15/1940, MRN [id number]  PCP:  Irven Shelling, MD      History of Present Illness: Camdon Saetern is a 73 y.o. male who has h/o MVR and Parkinsons. He has been drinking more fluid to try to help treat orthostatic hypotension. No recent falls. His flomax was stopped and his orthostatic issues improved. No on fluorinef. Rides bike for 2-3 x/week. No longer goes to an aerobics class. Mitral Valve Regurgitation:  dry cough, swallowing problems. No orthopnea or PND. No palpitations.    Wt Readings from Last 3 Encounters:  07/05/14 142 lb (64.411 kg)  06/21/14 147 lb (66.679 kg)  03/20/14 147 lb (66.679 kg)     Past Medical History  Diagnosis Date  . Scoliosis   . Heart disease   . Hypertrophy (benign) of prostate   . Asthma   . Hypercholesteremia   . Depression     Current Outpatient Prescriptions  Medication Sig Dispense Refill  . acetaminophen (TYLENOL) 325 MG tablet Take 2 tablets (650 mg total) by mouth every 6 (six) hours as needed.  20 tablet  0  . albuterol (PROVENTIL HFA;VENTOLIN HFA) 108 (90 BASE) MCG/ACT inhaler Inhale 2 puffs into the lungs every 6 (six) hours as needed for wheezing or shortness of breath.      . ALPRAZolam (XANAX) 0.5 MG tablet Take 0.25 mg by mouth every evening. Take 1 tab at 1600 every day.      Marland Kitchen aspirin EC 81 MG tablet Take 81 mg by mouth every morning.       . calcium carbonate (TUMS - DOSED IN MG ELEMENTAL CALCIUM) 500 MG chewable tablet Chew 1 tablet by mouth daily with breakfast.      . carbidopa-levodopa (SINEMET IR) 25-100 MG per tablet 1.5 tabs 5x a day at 7am, 10am, 1pm, 4pm and 7pm  240 tablet  6  . cholecalciferol (VITAMIN D) 1000 UNITS tablet Take 2,000 Units by mouth every morning.       . clobetasol cream  (TEMOVATE) 3.81 % Apply 1 application topically 2 (two) times daily.      . clonazePAM (KLONOPIN) 0.5 MG tablet Take 0.75 mg by mouth every evening. Take 1.5 tabs at 2000 every evening.      . fludrocortisone (FLORINEF) 0.1 MG tablet Take 0.3 mg by mouth every morning.       . gabapentin (NEURONTIN) 400 MG capsule Take 400 mg by mouth at bedtime.       Marland Kitchen latanoprost (XALATAN) 0.005 % ophthalmic solution Place 1 drop into both eyes every other day.       . minocycline (MINOCIN,DYNACIN) 100 MG capsule Take 100 mg by mouth 2 (two) times daily.       . mirtazapine (REMERON) 15 MG tablet       . MYRBETRIQ 25 MG TB24 tablet Take 25 mg by mouth every evening.       Marland Kitchen oxybutynin (DITROPAN-XL) 10 MG 24 hr tablet Take 10 mg by mouth at bedtime.      . potassium chloride 20 MEQ/15ML (10%) SOLN Take 10 mEq by mouth daily.      . Pramox-PE-Glycerin-Petrolatum (PREPARATION H) 1-0.25-14.4-15 % CREA Place 1 application rectally 3 (three) times daily with meals as needed (when hemorrhoids  present).      . sertraline (ZOLOFT) 100 MG tablet Take 100 mg by mouth every morning.       . simvastatin (ZOCOR) 20 MG tablet Take 20 mg by mouth daily.      . traMADol (ULTRAM) 50 MG tablet Take 50 mg by mouth every 6 (six) hours as needed for moderate pain.       . Travoprost, BAK Free, (TRAVATAN) 0.004 % SOLN ophthalmic solution Place 1 drop into both eyes every evening.        No current facility-administered medications for this visit.    Allergies:    Allergies  Allergen Reactions  . Aricept [Donepezil Hcl] Other (See Comments)    Confusion  . Codeine Other (See Comments)    Per MAR  . Exelon [Rivastigmine Tartrate] Other (See Comments)    unknown  . Namenda [Memantine Hcl] Other (See Comments)    Urinary incontinence   . Diphenhydramine Other (See Comments)    Unknown     Social History:  The patient  reports that he has never smoked. He has never used smokeless tobacco. He reports that he does not drink  alcohol or use illicit drugs.   Family History:  The patient's family history includes CVA in his mother; Depression in his father.   ROS:  Please see the history of present illness.  No nausea, vomiting.  No fevers, chills.  No focal weakness.  No dysuria.    All other systems reviewed and negative.   PHYSICAL EXAM: VS:  BP 142/90  Pulse 59  Ht 5\' 9"  (1.753 m)  Wt 142 lb (64.411 kg)  BMI 20.96 kg/m2  SpO2 97% Well nourished, well developed, in no acute distress HEENT: normal Neck: no JVD, no carotid bruits Cardiac:  normal S1, S2; RRR;  Lungs:  clear to auscultation bilaterally, no wheezing, rhonchi or rales Abd: soft, nontender, no hepatomegaly Ext: ankle edema ; compression stockings in place Skin: warm and dry Neuro:   no focal abnormalities noted  EKG:  NSR, no ST segment changes    ASSESSMENT AND PLAN:  Mitral valve disorders  IMAGING: EKG    Stegall,Amy 05/28/2013 02:11:52 PM > Lauralyn Shadowens,JAY 05/28/2013 02:47:00 PM > NSR, no ST segments   Notes: SBE prophylaxis. s/p repair. No symptoms. Will hold off on echocardiogram this year.    2. Hyperlipidemia  Continue Simvastatin Tablet, 20 MG, TAKE 1 TABLET EVERY EVENING Notes: LDL 118 in 12/12. LDL 102 in 12/13. 1/15: LDL 102, TG 275.   3. Orthostatic hypotension  Notes: Agree with his efforts to stay well hydrate. LV function normal. Can tolerate fluid. likely related to, dysfunction of Parkinson's disease. Fluorinef helping.    Preventive Medicine  Adult topics discussed:  Diet: healthy diet.  Exercise: 5 days a week, at least 30 minutes of aerobic exercise.      Signed, Mina Marble, MD, [facility] 07/05/2014 12:09 PM

## 2014-10-06 ENCOUNTER — Emergency Department (HOSPITAL_COMMUNITY)
Admission: EM | Admit: 2014-10-06 | Discharge: 2014-10-06 | Disposition: A | Discharge status: Home / Self Care | Payer: Medicare Other | Attending: Emergency Medicine | Admitting: Emergency Medicine

## 2014-10-06 ENCOUNTER — Encounter (HOSPITAL_COMMUNITY): Payer: Self-pay | Admitting: *Deleted

## 2014-10-06 DIAGNOSIS — Z9889 Other specified postprocedural states: Secondary | ICD-10-CM | POA: Insufficient documentation

## 2014-10-06 DIAGNOSIS — M419 Scoliosis, unspecified: Secondary | ICD-10-CM | POA: Diagnosis not present

## 2014-10-06 DIAGNOSIS — J45909 Unspecified asthma, uncomplicated: Secondary | ICD-10-CM | POA: Diagnosis not present

## 2014-10-06 DIAGNOSIS — G20 Parkinson's disease: Secondary | ICD-10-CM | POA: Diagnosis not present

## 2014-10-06 DIAGNOSIS — Y9389 Activity, other specified: Secondary | ICD-10-CM | POA: Insufficient documentation

## 2014-10-06 DIAGNOSIS — S01111A Laceration without foreign body of right eyelid and periocular area, initial encounter: Secondary | ICD-10-CM | POA: Diagnosis not present

## 2014-10-06 DIAGNOSIS — Z79899 Other long term (current) drug therapy: Secondary | ICD-10-CM | POA: Diagnosis not present

## 2014-10-06 DIAGNOSIS — Z8679 Personal history of other diseases of the circulatory system: Secondary | ICD-10-CM | POA: Diagnosis not present

## 2014-10-06 DIAGNOSIS — Y998 Other external cause status: Secondary | ICD-10-CM | POA: Diagnosis not present

## 2014-10-06 DIAGNOSIS — W01198A Fall on same level from slipping, tripping and stumbling with subsequent striking against other object, initial encounter: Secondary | ICD-10-CM | POA: Diagnosis not present

## 2014-10-06 DIAGNOSIS — E780 Pure hypercholesterolemia: Secondary | ICD-10-CM | POA: Diagnosis not present

## 2014-10-06 DIAGNOSIS — F329 Major depressive disorder, single episode, unspecified: Secondary | ICD-10-CM | POA: Insufficient documentation

## 2014-10-06 DIAGNOSIS — Z792 Long term (current) use of antibiotics: Secondary | ICD-10-CM | POA: Diagnosis not present

## 2014-10-06 DIAGNOSIS — W19XXXA Unspecified fall, initial encounter: Secondary | ICD-10-CM

## 2014-10-06 DIAGNOSIS — Y92128 Other place in nursing home as the place of occurrence of the external cause: Secondary | ICD-10-CM | POA: Insufficient documentation

## 2014-10-06 DIAGNOSIS — Z87448 Personal history of other diseases of urinary system: Secondary | ICD-10-CM | POA: Diagnosis not present

## 2014-10-06 DIAGNOSIS — Z7982 Long term (current) use of aspirin: Secondary | ICD-10-CM | POA: Diagnosis not present

## 2014-10-06 HISTORY — DX: Parkinson's disease without dyskinesia, without mention of fluctuations: G20.A1

## 2014-10-06 HISTORY — DX: Parkinson's disease: G20

## 2014-10-06 NOTE — ED Notes (Signed)
PA at bedside Pt alert and oriented x4. Respirations even and unlabored, bilateral symmetrical rise and fall of chest. Skin warm and dry. In no acute distress. Denies needs.   

## 2014-10-06 NOTE — ED Notes (Signed)
[facility] at bedside 

## 2014-10-06 NOTE — ED Notes (Signed)
Bed: DI26 Expected date: 10/06/14 Expected time: 9:43 AM Means of arrival:  Comments: fall

## 2014-10-06 NOTE — ED Provider Notes (Signed)
CSN: [id number]     Arrival date & time 10/06/14  8841 History   First MD Initiated Contact with Patient 10/06/14 1005     Chief Complaint  Patient presents with  . Fall  . Head Laceration   HPI  Patient is a 73 year old male with a history of Parkinson's disease who presents emergency room for evaluation after a fall. Patient states that he was leaning forward in his [employer]es when he tipped over and fell onto the ground. He did hit his head on an unknown object, but did not loose consciousness. Patient states that he takes a daily baby aspirin but is on no other blood thinners at this time. Patient denies pain in any of his limbs or in his neck or back. Patient is in a nursing home for his Parkinson's disease. Patient denies any changes in his vision, nausea, vomiting.  Past Medical History  Diagnosis Date  . Scoliosis   . Heart disease   . Hypertrophy (benign) of prostate   . Asthma   . Hypercholesteremia   . Depression   . Parkinson disease    Past Surgical History  Procedure Laterality Date  . Refractive surgery Bilateral     2 on OD, 1 on OS  . Mitral valve repair N/A   . Cardiac catheterization  2001   Family History  Problem Relation Age of Onset  . Depression Father   . CVA Mother    History  Substance Use Topics  . Smoking status: Never Smoker   . Smokeless tobacco: Never Used  . Alcohol Use: No    Review of Systems  Eyes: Negative for visual disturbance.  Gastrointestinal: Negative for nausea and vomiting.  Musculoskeletal: Negative for back pain and neck pain.  Skin: Positive for wound.  Neurological: Negative for dizziness, weakness, numbness and headaches.  All other systems reviewed and are negative.     Allergies  Aricept; Codeine; Exelon; Namenda; and Diphenhydramine  Home Medications   Prior to Admission medications   Medication Sig Start Date End Date Taking? Authorizing Provider  acetaminophen (TYLENOL) 325 MG tablet Take 2 tablets (650  mg total) by mouth every 6 (six) hours as needed. 04/23/14  Yes Mirna Mires, MD  albuterol (PROVENTIL HFA;VENTOLIN HFA) 108 (90 BASE) MCG/ACT inhaler Inhale 2 puffs into the lungs every 6 (six) hours as needed for wheezing or shortness of breath.   Yes Historical Provider, MD  aspirin EC 81 MG tablet Take 81 mg by mouth every morning.    Yes Historical Provider, MD  calcium carbonate (TUMS - DOSED IN MG ELEMENTAL CALCIUM) 500 MG chewable tablet Chew 1 tablet by mouth daily with breakfast.   Yes Historical Provider, MD  carbidopa-levodopa (SINEMET IR) 10-100 MG per tablet Take 1 tablet by mouth 4 (four) times daily. Use with carbidopa-levodopa 10-100 tablets   Yes Historical Provider, MD  carbidopa-levodopa (SINEMET IR) 25-100 MG per tablet Take 1 tablet by mouth 4 (four) times daily. Use with carbidopa-Levodopa 10-100 tablets   Yes Historical Provider, MD  cholecalciferol (VITAMIN D) 1000 UNITS tablet Take 2,000 Units by mouth every morning.    Yes Historical Provider, MD  clonazePAM (KLONOPIN) 0.5 MG tablet Take 0.5 mg by mouth every evening. Take 1 tab 2000 every evening.   Yes Historical Provider, MD  fludrocortisone (FLORINEF) 0.1 MG tablet Take 0.3 mg by mouth every morning.    Yes Historical Provider, MD  latanoprost (XALATAN) 0.005 % ophthalmic solution Place 1 drop into both eyes  every other day.  12/09/12  Yes Historical Provider, MD  minocycline (MINOCIN,DYNACIN) 100 MG capsule Take 100 mg by mouth 2 (two) times daily.  01/25/13  Yes Historical Provider, MD  mirabegron ER (MYRBETRIQ) 50 MG TB24 tablet Take 50 mg by mouth daily.   Yes Historical Provider, MD  mirtazapine (REMERON) 15 MG tablet Take 15 mg by mouth at bedtime.  06/03/14  Yes Historical Provider, MD  Pramox-PE-Glycerin-Petrolatum (PREPARATION H) 1-0.25-14.4-15 % CREA Place 1 application rectally 3 (three) times daily with meals as needed (when hemorrhoids present).   Yes Historical Provider, MD  sertraline (ZOLOFT) 100 MG tablet  Take 100 mg by mouth every morning.    Yes Historical Provider, MD  traMADol (ULTRAM) 50 MG tablet Take 50 mg by mouth every 6 (six) hours as needed for moderate pain.    Yes Historical Provider, MD  Travoprost, BAK Free, (TRAVATAN) 0.004 % SOLN ophthalmic solution Place 1 drop into both eyes every evening.    Yes Historical Provider, MD  carbidopa-levodopa (SINEMET IR) 25-100 MG per tablet 1.5 tabs 5x a day at 7am, 10am, 1pm, 4pm and 7pm Patient not taking: Reported on 10/06/2014 06/21/14   Hulen Luster, DO   BP 149/71 mmHg  Pulse 77  Temp(Src) 97.4 F (36.3 C) (Oral)  Resp 16  SpO2 99% Physical Exam  Constitutional: He is oriented to person, place, and time. He appears well-developed and well-nourished. No distress.  HENT:  Head: Normocephalic.  Mouth/Throat: Oropharynx is clear and moist. No oropharyngeal exudate.  3 cm curvilinear laceration above the right eyebrow. Wound is clean and dry. There is no evidence for foreign bodies. There is old ecchymosis over the left orbit with no edema or swelling. No facial tenderness palpation.  Eyes: Conjunctivae and EOM are normal. Pupils are equal, round, and reactive to light. No scleral icterus.  Neck: Normal range of motion. Neck supple. No JVD present. No spinous process tenderness and no muscular tenderness present. No thyromegaly present.  Cardiovascular: Normal rate, regular rhythm, normal heart sounds and intact distal pulses.  Exam reveals no gallop and no friction rub.   No murmur heard. Pulmonary/Chest: Effort normal and breath sounds normal. No respiratory distress. He has no wheezes. He has no rales. He exhibits no tenderness.  Abdominal: Soft. Bowel sounds are normal.  Musculoskeletal: Normal range of motion.  Lymphadenopathy:    He has no cervical adenopathy.  Neurological: He is alert and oriented to person, place, and time. He has normal strength. No cranial nerve deficit or sensory deficit.  Cogwheel rigidity with  movement  Skin: Skin is warm and dry. He is not diaphoretic.  Psychiatric: He has a normal mood and affect. His behavior is normal. Judgment and thought content normal.  Nursing note and vitals reviewed.   ED Course  LACERATION REPAIR Date/Time: 10/06/2014 11:10 AM Performed by: Starlyn Skeans A Authorized by: Starlyn Skeans A Consent: Verbal consent obtained. Consent given by: patient Patient understanding: patient states understanding of the procedure being performed Patient consent: the patient's understanding of the procedure matches consent given Procedure consent: procedure consent matches procedure scheduled Relevant documents: relevant documents present and verified Test results: test results available and properly labeled Site marked: the operative site was marked Imaging studies: imaging studies available Patient identity confirmed: verbally with patient Time out: Immediately prior to procedure a "time out" was called to verify the correct patient, procedure, equipment, support staff and site/side marked as required. Body area: head/neck Location details: right eyebrow Laceration length:  3 cm Foreign bodies: no foreign bodies Tendon involvement: none Nerve involvement: none Vascular damage: no Irrigation solution: tap water Amount of cleaning: standard Debridement: none Degree of undermining: none Skin closure: glue Patient tolerance: Patient tolerated the procedure well with no immediate complications   (including critical care time) Labs Review Labs Reviewed - No data to display  Imaging Review No results found.   EKG Interpretation None      MDM   Final diagnoses:  Fall, initial encounter  Eyebrow laceration, right, initial encounter   Patient is a 73 year old male who presents to the emergency room for evaluation after a fall. Patient has no neurological deficits on examination. He does have cogwheel rigidity. He also has laceration to the  right eye. Patient's tetanus is up-to-date. Laceration was repaired as seen above. Patient is declining head CT at this time. Patient is to return to the emergency room for changes in his baseline behavior, intractable nausea and vomiting, or any other concerning symptoms. He is also to return for signs of infection and the laceration which we discussed. Patient is stable for discharge at this time. Patient was seen by and discussed with Dr. Tomi Bamberger who agrees with the above workup and plan.    Cherylann Parr, PA-C 10/06/14 [place], MD 10/06/14 [phone]

## 2014-10-06 NOTE — ED Notes (Signed)
[facility] called for transport back to facility 

## 2014-10-06 NOTE — ED Notes (Addendum)
Per ems pt is from [facility]. Hx of parkinsons, at facility for rehab. Pt was sitting sitting in wheelchair, wheelchair shifted while pt went to stand up, pt fell forwards, hit his head on corner of table. No LOC. Small laceration to right forehead. Bleeding controlled. Denies pain.

## 2014-10-06 NOTE — Discharge Instructions (Signed)
Facial Laceration  A facial laceration is a cut on the face. These injuries can be painful and cause bleeding. Lacerations usually heal quickly, but they need special care to reduce scarring. DIAGNOSIS  Your health care provider will take a medical history, ask for details about how the injury occurred, and examine the wound to determine how deep the cut is. TREATMENT  Some facial lacerations may not require closure. Others may not be able to be closed because of an increased risk of infection. The risk of infection and the chance for successful closure will depend on various factors, including the amount of time since the injury occurred. The wound may be cleaned to help prevent infection. If closure is appropriate, pain medicines may be given if needed. Your health care provider will use stitches (sutures), wound glue (adhesive), or skin adhesive strips to repair the laceration. These tools bring the skin edges together to allow for faster healing and a better cosmetic outcome. If needed, you may also be given a tetanus shot. HOME CARE INSTRUCTIONS  Only take over-the-counter or prescription medicines as directed by your health care provider.  Follow your health care provider's instructions for wound care. These instructions will vary depending on the technique used for closing the wound. For Sutures:  Keep the wound clean and dry.   If you were given a bandage (dressing), you should change it at least once a day. Also change the dressing if it becomes wet or dirty, or as directed by your health care provider.   Wash the wound with soap and water 2 times a day. Rinse the wound off with water to remove all soap. Pat the wound dry with a clean towel.   After cleaning, apply a thin layer of the antibiotic ointment recommended by your health care provider. This will help prevent infection and keep the dressing from sticking.   You may shower as usual after the first 24 hours. Do not soak the  wound in water until the sutures are removed.   Get your sutures removed as directed by your health care provider. With facial lacerations, sutures should usually be taken out after 4-5 days to avoid stitch marks.   Wait a few days after your sutures are removed before applying any makeup. For Skin Adhesive Strips:  Keep the wound clean and dry.   Do not get the skin adhesive strips wet. You may bathe carefully, using caution to keep the wound dry.   If the wound gets wet, pat it dry with a clean towel.   Skin adhesive strips will fall off on their own. You may trim the strips as the wound heals. Do not remove skin adhesive strips that are still stuck to the wound. They will fall off in time.  For Wound Adhesive:  You may briefly wet your wound in the shower or bath. Do not soak or scrub the wound. Do not swim. Avoid periods of heavy sweating until the skin adhesive has fallen off on its own. After showering or bathing, gently pat the wound dry with a clean towel.   Do not apply liquid medicine, cream medicine, ointment medicine, or makeup to your wound while the skin adhesive is in place. This may loosen the film before your wound is healed.   If a dressing is placed over the wound, be careful not to apply tape directly over the skin adhesive. This may cause the adhesive to be pulled off before the wound is healed.   Avoid   prolonged exposure to sunlight or tanning lamps while the skin adhesive is in place.  The skin adhesive will usually remain in place for 5-10 days, then naturally fall off the skin. Do not pick at the adhesive film.  After Healing: Once the wound has healed, cover the wound with sunscreen during the day for 1 full year. This can help minimize scarring. Exposure to ultraviolet light in the first year will darken the scar. It can take 1-2 years for the scar to lose its redness and to heal completely.  SEEK IMMEDIATE MEDICAL CARE IF:  You have redness, pain, or  swelling around the wound.   You see ayellowish-white fluid (pus) coming from the wound.   You have chills or a fever.  MAKE SURE YOU:  Understand these instructions.  Will watch your condition.  Will get help right away if you are not doing well or get worse. Document Released: 11/18/2004 Document Revised: 08/01/2013 Document Reviewed: 05/24/2013 Exit[employer] Patient Information 2015 [employer]. This information is not intended to replace advice given to you by your health care provider. Make sure you discuss any questions you have with your health care provider.  

## 2014-11-26 ENCOUNTER — Encounter (HOSPITAL_COMMUNITY): Payer: Self-pay | Admitting: Emergency Medicine

## 2014-11-26 ENCOUNTER — Emergency Department (HOSPITAL_COMMUNITY): Payer: Medicare Other

## 2014-11-26 ENCOUNTER — Emergency Department (HOSPITAL_COMMUNITY)
Admission: EM | Admit: 2014-11-26 | Discharge: 2014-11-26 | Disposition: A | Discharge status: Home / Self Care | Payer: Medicare Other | Attending: Emergency Medicine | Admitting: Emergency Medicine

## 2014-11-26 DIAGNOSIS — F0390 Unspecified dementia without behavioral disturbance: Secondary | ICD-10-CM | POA: Diagnosis not present

## 2014-11-26 DIAGNOSIS — Z7982 Long term (current) use of aspirin: Secondary | ICD-10-CM | POA: Insufficient documentation

## 2014-11-26 DIAGNOSIS — Z7952 Long term (current) use of systemic steroids: Secondary | ICD-10-CM | POA: Diagnosis not present

## 2014-11-26 DIAGNOSIS — E780 Pure hypercholesterolemia: Secondary | ICD-10-CM | POA: Insufficient documentation

## 2014-11-26 DIAGNOSIS — G20 Parkinson's disease: Secondary | ICD-10-CM | POA: Diagnosis not present

## 2014-11-26 DIAGNOSIS — Z9889 Other specified postprocedural states: Secondary | ICD-10-CM | POA: Insufficient documentation

## 2014-11-26 DIAGNOSIS — H409 Unspecified glaucoma: Secondary | ICD-10-CM | POA: Insufficient documentation

## 2014-11-26 DIAGNOSIS — Z79899 Other long term (current) drug therapy: Secondary | ICD-10-CM | POA: Diagnosis not present

## 2014-11-26 DIAGNOSIS — M419 Scoliosis, unspecified: Secondary | ICD-10-CM | POA: Insufficient documentation

## 2014-11-26 DIAGNOSIS — J45909 Unspecified asthma, uncomplicated: Secondary | ICD-10-CM | POA: Insufficient documentation

## 2014-11-26 DIAGNOSIS — W1839XA Other fall on same level, initial encounter: Secondary | ICD-10-CM | POA: Insufficient documentation

## 2014-11-26 DIAGNOSIS — Y9389 Activity, other specified: Secondary | ICD-10-CM | POA: Diagnosis not present

## 2014-11-26 DIAGNOSIS — I10 Essential (primary) hypertension: Secondary | ICD-10-CM | POA: Insufficient documentation

## 2014-11-26 DIAGNOSIS — S0990XA Unspecified injury of head, initial encounter: Secondary | ICD-10-CM | POA: Diagnosis present

## 2014-11-26 DIAGNOSIS — Y998 Other external cause status: Secondary | ICD-10-CM | POA: Insufficient documentation

## 2014-11-26 DIAGNOSIS — W19XXXA Unspecified fall, initial encounter: Secondary | ICD-10-CM

## 2014-11-26 DIAGNOSIS — Z9181 History of falling: Secondary | ICD-10-CM | POA: Insufficient documentation

## 2014-11-26 DIAGNOSIS — F329 Major depressive disorder, single episode, unspecified: Secondary | ICD-10-CM | POA: Diagnosis not present

## 2014-11-26 DIAGNOSIS — S01111A Laceration without foreign body of right eyelid and periocular area, initial encounter: Secondary | ICD-10-CM

## 2014-11-26 DIAGNOSIS — Y92121 Bathroom in nursing home as the place of occurrence of the external cause: Secondary | ICD-10-CM | POA: Diagnosis not present

## 2014-11-26 DIAGNOSIS — N400 Enlarged prostate without lower urinary tract symptoms: Secondary | ICD-10-CM | POA: Insufficient documentation

## 2014-11-26 HISTORY — DX: Unspecified glaucoma: H40.9

## 2014-11-26 HISTORY — DX: Essential (primary) hypertension: I10

## 2014-11-26 MED ORDER — CARBIDOPA-LEVODOPA 25-100 MG PO TABS
1.5000 | ORAL_TABLET | Freq: Once | ORAL | Status: AC
  Administered 2014-11-26: 1.5 via ORAL
  Filled 2014-11-26 (×2): qty 1.5

## 2014-11-26 NOTE — ED Notes (Signed)
PA at bedside.

## 2014-11-26 NOTE — ED Notes (Signed)
MD at bedside. 

## 2014-11-26 NOTE — ED Notes (Signed)
Per EMS. Pt from [facility], hx of dementia, oriented per norm. Pt sent here for evaluation after unwitnessed fall. Pt reports he was trying to turn on his light when he slipped/tripped. EMS noted laceration above R eyebrow. Denies LOC, neck or back pain. Pt ambulatory with assist or walker. Pt not on blood thinners and has not taken am medications yet today.

## 2014-11-26 NOTE — Discharge Instructions (Signed)
Fall Prevention and Home Safety Falls cause injuries and can affect all age groups. It is possible to use preventive measures to significantly decrease the likelihood of falls. There are many simple measures which can make your home safer and prevent falls. OUTDOORS  Repair cracks and edges of walkways and driveways.  Remove high doorway thresholds.  Trim shrubbery on the main path into your home.  Have good outside lighting.  Clear walkways of tools, rocks, debris, and clutter.  Check that handrails are not broken and are securely fastened. Both sides of steps should have handrails.  Have leaves, snow, and ice cleared regularly.  Use sand or salt on walkways during winter months.  In the garage, clean up grease or oil spills. BATHROOM  Install night lights.  Install grab bars by the toilet and in the tub and shower.  Use non-skid mats or decals in the tub or shower.  Place a plastic non-slip stool in the shower to sit on, if needed.  Keep floors dry and clean up all water on the floor immediately.  Remove soap buildup in the tub or shower on a regular basis.  Secure bath mats with non-slip, double-sided rug tape.  Remove throw rugs and tripping hazards from the floors. BEDROOMS  Install night lights.  Make sure a bedside light is easy to reach.  Do not use oversized bedding.  Keep a telephone by your bedside.  Have a firm chair with side arms to use for getting dressed.  Remove throw rugs and tripping hazards from the floor. KITCHEN  Keep handles on pots and pans turned toward the center of the stove. Use back burners when possible.  Clean up spills quickly and allow time for drying.  Avoid walking on wet floors.  Avoid hot utensils and knives.  Position shelves so they are not too high or low.  Place commonly used objects within easy reach.  If necessary, use a sturdy step stool with a grab bar when reaching.  Keep electrical cables out of the  way.  Do not use floor polish or wax that makes floors slippery. If you must use wax, use non-skid floor wax.  Remove throw rugs and tripping hazards from the floor. STAIRWAYS  Never leave objects on stairs.  Place handrails on both sides of stairways and use them. Fix any loose handrails. Make sure handrails on both sides of the stairways are as long as the stairs.  Check carpeting to make sure it is firmly attached along stairs. Make repairs to worn or loose carpet promptly.  Avoid placing throw rugs at the top or bottom of stairways, or properly secure the rug with carpet tape to prevent slippage. Get rid of throw rugs, if possible.  Have an electrician put in a light switch at the top and bottom of the stairs. OTHER FALL PREVENTION TIPS  Wear low-heel or rubber-soled shoes that are supportive and fit well. Wear closed toe shoes.  When using a stepladder, make sure it is fully opened and both spreaders are firmly locked. Do not climb a closed stepladder.  Add color or contrast paint or tape to grab bars and handrails in your home. Place contrasting color strips on first and last steps.  Learn and use mobility aids as needed. Install an electrical emergency response system.  Turn on lights to avoid dark areas. Replace light bulbs that burn out immediately. Get light switches that glow.  Arrange furniture to create clear pathways. Keep furniture in the same place.  Firmly attach carpet with non-skid or double-sided tape.  Eliminate uneven floor surfaces.  Select a carpet pattern that does not visually hide the edge of steps.  Be aware of all pets. OTHER HOME SAFETY TIPS  Set the water temperature for 120 F (48.8 C).  Keep emergency numbers on or near the telephone.  Keep smoke detectors on every level of the home and near sleeping areas. Document Released: 10/01/2002 Document Revised: 04/11/2012 Document Reviewed: 12/31/2011 [facility] Patient Information 2015  [place], [place]. This information is not intended to replace advice given to you by your health care provider. Make sure you discuss any questions you have with your health care provider.   Facial Laceration A facial laceration is a cut on the face. These injuries can be painful and cause bleeding. Some cuts may need to be closed with stitches (sutures), skin adhesive strips, or wound glue. Cuts usually heal quickly but can leave a scar. It can take 1-2 years for the scar to go away completely. HOME CARE   Only take medicines as told by your doctor.  Follow your doctor's instructions for wound care. For Stitches:  Keep the cut clean and dry.  If you have a bandage (dressing), change it at least once a day. Change the bandage if it gets wet or dirty, or as told by your doctor.  Wash the cut with soap and water 2 times a day. Rinse the cut with water. Pat it dry with a clean towel.  Put a thin layer of medicated cream on the cut as told by your doctor.  You may shower after the first 24 hours. Do not soak the cut in water until the stitches are removed.  Have your stitches removed as told by your doctor.  Do not wear any makeup until a few days after your stitches are removed. For Skin Adhesive Strips:  Keep the cut clean and dry.  Do not get the strips wet. You may take a bath, but be careful to keep the cut dry.  If the cut gets wet, pat it dry with a clean towel.  The strips will fall off on their own. Do not remove the strips that are still stuck to the cut. For Wound Glue:  You may shower or take baths. Do not soak or scrub the cut. Do not swim. Avoid heavy sweating until the glue falls off on its own. After a shower or bath, pat the cut dry with a clean towel.  Do not put medicine or makeup on your cut until the glue falls off.  If you have a bandage, do not put tape over the glue.  Avoid lots of sunlight or tanning lamps until the glue falls off.  The glue will fall off  on its own in 5-10 days. Do not pick at the glue. After Healing: Put sunscreen on the cut for the first year to reduce your scar. GET HELP RIGHT AWAY IF:   Your cut area gets red, painful, or puffy (swollen).  You see a yellowish-white fluid (pus) coming from the cut.  You have chills or a fever. MAKE SURE YOU:   Understand these instructions.  Will watch your condition.  Will get help right away if you are not doing well or get worse. Document Released: 03/29/2008 Document Revised: 08/01/2013 Document Reviewed: 05/24/2013 [facility] Patient Information 2015 [place], [place]. This information is not intended to replace advice given to you by your health care provider. Make sure you discuss any questions  you have with your health care provider.

## 2014-11-26 NOTE — ED Notes (Signed)
Bed: BM21 Expected date:  Expected time:  Means of arrival:  Comments: Ems- laceration

## 2014-11-26 NOTE — ED Notes (Signed)
Ice water given. RN notified.

## 2014-11-26 NOTE — ED Notes (Signed)
Patient transported to CT 

## 2014-11-26 NOTE — ED Notes (Signed)
Graham crackers, peanut butter, and applesauce given.

## 2014-11-26 NOTE — ED Provider Notes (Signed)
CSN: [id number]     Arrival date & time 11/26/14  2426 History   First MD Initiated Contact with Patient 11/26/14 [phone]     Chief Complaint  Patient presents with  . Fall  . Head Laceration   HPI  Patient is a 74 year old male who presents emergency room for evaluation after an unwitnessed fall. Patient has past medical history scoliosis, heart disease, BPH, asthma, depression, and Parkinson's disease who presents via EMS from a nursing home. Patient has a history of frequent falls. Patient states that he was on his Antionette Poles was trying to turn the light on in the bathroom when he tipped over and fell. He states that he did not lose consciousness. He is denying pain at this time in his neck, back, chest, abdomen, or extremities. He denies any changes in vision, nausea, vomiting. Patient is worried about the cut over his right eyebrow. He feels that this may by his glasses. He states that his tetanus is up-to-date.He is not on blood thinners at this time.  Past Medical History  Diagnosis Date  . Scoliosis   . Heart disease   . Hypertrophy (benign) of prostate   . Asthma   . Hypercholesteremia   . Depression   . Parkinson disease   . Hypertension   . Glaucoma    Past Surgical History  Procedure Laterality Date  . Refractive surgery Bilateral     2 on OD, 1 on OS  . Mitral valve repair N/A   . Cardiac catheterization  2001   Family History  Problem Relation Age of Onset  . Depression Father   . CVA Mother    History  Substance Use Topics  . Smoking status: Never Smoker   . Smokeless tobacco: Never Used  . Alcohol Use: No    Review of Systems  Constitutional: Negative for fever, chills and fatigue.  Eyes: Negative for visual disturbance.  Respiratory: Negative for chest tightness and shortness of breath.   Cardiovascular: Negative for chest pain.  Gastrointestinal: Negative for nausea and vomiting.  Musculoskeletal: Negative for back pain and neck pain.  Skin: Negative  for rash.  Neurological: Negative for dizziness, speech difficulty, weakness and headaches.  All other systems reviewed and are negative.     Allergies  Aricept; Codeine; Exelon; Namenda; and Diphenhydramine  Home Medications   Prior to Admission medications   Medication Sig Start Date End Date Taking? Authorizing Provider  acetaminophen (TYLENOL) 325 MG tablet Take 2 tablets (650 mg total) by mouth every 6 (six) hours as needed. Patient taking differently: Take 650 mg by mouth every 6 (six) hours as needed (for pain).  04/23/14  Yes Mirna Mires, MD  albuterol (PROVENTIL HFA;VENTOLIN HFA) 108 (90 BASE) MCG/ACT inhaler Inhale 2 puffs into the lungs every 4 (four) hours as needed for wheezing or shortness of breath.    Yes Historical Provider, MD  aspirin EC 81 MG tablet Take 81 mg by mouth every morning.    Yes Historical Provider, MD  calcium carbonate (TUMS - DOSED IN MG ELEMENTAL CALCIUM) 500 MG chewable tablet Chew 1 tablet by mouth daily with breakfast.   Yes Historical Provider, MD  carbidopa-levodopa (SINEMET IR) 25-100 MG per tablet 1.5 tabs 5x a day at 7am, 10am, 1pm, 4pm and 7pm Patient taking differently: Take 1.5 tablets by mouth 4 (four) times daily. Takes at 0800, 1100, 1400, 1700 06/21/14  Yes Hulen Luster, DO  cholecalciferol (VITAMIN D) 1000 UNITS tablet Take 2,000 Units by  mouth every morning.    Yes Historical Provider, MD  fludrocortisone (FLORINEF) 0.1 MG tablet Take 0.3 mg by mouth every morning.    Yes Historical Provider, MD  GATORADE, BH, Take 8 oz by mouth daily with breakfast.   Yes Historical Provider, MD  guaifenesin (HUMIBID E) 400 MG TABS tablet Take 400 mg by mouth 2 (two) times daily.   Yes Historical Provider, MD  ibuprofen (ADVIL,MOTRIN) 200 MG tablet Take 400 mg by mouth every 4 (four) hours as needed (for joint pain). Offer first before Tramadol   Yes Historical Provider, MD  latanoprost (XALATAN) 0.005 % ophthalmic solution Place 1 drop into both  eyes every other day.  12/09/12  Yes Historical Provider, MD  loperamide (IMODIUM) 2 MG capsule Take 2 mg by mouth as needed for diarrhea or loose stools.   Yes Historical Provider, MD  menthol-cetylpyridinium (CEPACOL) 3 MG lozenge Take 1 lozenge by mouth 4 (four) times daily.   Yes Historical Provider, MD  minocycline (MINOCIN,DYNACIN) 100 MG capsule Take 100 mg by mouth every 12 (twelve) hours.  01/25/13  Yes Historical Provider, MD  mirabegron ER (MYRBETRIQ) 50 MG TB24 tablet Take 50 mg by mouth at bedtime.    Yes Historical Provider, MD  mirtazapine (REMERON) 15 MG tablet Take 15 mg by mouth at bedtime.  06/03/14  Yes Historical Provider, MD  Pramox-PE-Glycerin-Petrolatum (PREPARATION H) 1-0.25-14.4-15 % CREA Place 1 application rectally 3 (three) times daily with meals as needed (when hemorrhoids present).   Yes Historical Provider, MD  sertraline (ZOLOFT) 100 MG tablet Take 100 mg by mouth every morning.    Yes Historical Provider, MD  traMADol (ULTRAM) 50 MG tablet Take 50 mg by mouth every 6 (six) hours as needed (for pain).    Yes Historical Provider, MD   BP 160/98 mmHg  Pulse 95  Temp(Src) 98 F (36.7 C) (Oral)  Resp 16  SpO2 99% Physical Exam  Constitutional: He is oriented to person, place, and time. He appears well-developed and well-nourished. No distress.  HENT:  Head: Normocephalic.  Mouth/Throat: Oropharynx is clear and moist. No oropharyngeal exudate.  Small crescent shaped cut over the right eyebrow which appears in line with his glasses. Bleeding well controlled. Wound is clean with no visible foreign bodies. Old healing left black eye. No facial tenderness to palpation.  Eyes: Conjunctivae and EOM are normal. Pupils are equal, round, and reactive to light. No scleral icterus.  Neck: Normal range of motion. Neck supple. No JVD present. No thyromegaly present.  Cardiovascular: Normal rate, regular rhythm, normal heart sounds and intact distal pulses.  Exam reveals no gallop  and no friction rub.   No murmur heard. Pulmonary/Chest: Effort normal and breath sounds normal. No respiratory distress. He has no wheezes. He has no rales. He exhibits no tenderness.  Abdominal: Soft. Bowel sounds are normal. He exhibits no distension and no mass. There is no tenderness. There is no rebound and no guarding.  Musculoskeletal: Normal range of motion.  Neck and back are nontender to palpation. There are no palpable step-offs or deformities of the bony cervical, thoracic, and lumbar spine. Patient moves all extremities with some cogwheel rigidity. He has full range of motion of his elbows knees shoulders and hips and ankles.  Lymphadenopathy:    He has no cervical adenopathy.  Neurological: He is alert and oriented to person, place, and time. He has normal strength. No cranial nerve deficit or sensory deficit. Coordination normal.  Skin: Skin is warm and dry.  He is not diaphoretic.  Psychiatric: He has a normal mood and affect. His behavior is normal. Judgment and thought content normal.  Nursing note and vitals reviewed.   ED Course  Procedures (including critical care time)  LACERATION REPAIR Performed by: Cherylann Parr Authorized by: Starlyn Skeans A Consent: Verbal consent obtained. Risks and benefits: risks, benefits and alternatives were discussed Consent given by: patient Patient identity confirmed: provided demographic data Prepped and Draped in normal sterile fashion Wound explored  Laceration Location: right eyebrow  Laceration Length: 1 cm  No Foreign Bodies seen or palpated  Irrigation method: gauze with tap water Amount of cleaning: standard  Skin closure: glue  Patient tolerance: Patient tolerated the procedure well with no immediate complications.  Labs Review Labs Reviewed - No data to display  Imaging Review Ct Head Wo Contrast  11/26/2014   CLINICAL DATA:  Unwitnessed fall. Bruise over right frontal region. History of dementia   EXAM: CT HEAD WITHOUT CONTRAST  TECHNIQUE: Contiguous axial images were obtained from the base of the skull through the vertex without intravenous contrast.  COMPARISON:  April 14, 2014  FINDINGS: There is mild diffuse atrophy. There is no intracranial mass, hemorrhage, extra-axial fluid collection, or midline shift. There is mild patchy small vessel disease in the centra semiovale bilaterally. No new gray-white compartment lesions. No acute infarct apparent. The bony calvarium appears intact. The mastoid air cells are clear. There is calcification along the periphery of the right globe, stable.  IMPRESSION: Atrophy with mild periventricular small vessel disease. No intracranial mass, hemorrhage, or acute appearing infarct. Stable calcification along the periphery of the right globe.   Electronically Signed   By: Lowella Grip M.D.   On: 11/26/2014 10:12     EKG Interpretation None      MDM   Final diagnoses:  Fall, initial encounter  Eyebrow laceration, right, initial encounter   Patient is a 74 year old male who presents emergency room for evaluation after a fall. Physical exam reveals no neurological deficits.  Patient does have a small 1 cm laceration above the right eye that was repaired with glue. Patient tolerated procedure well. Patient up-to-date on his tetanus. CT of the head performed given fall which is negative for acute bleeding. Patient to return for worsening confusion, intractable nausea and vomiting, or any other concerning symptoms. He has wife state understanding and agreement at this time. Patient stable for discharge. [facility] has been called to transfer him back to [facility].  Patient seen by and discussed with Dr. Mingo Amber who agrees the above workup and plan.    Cherylann Parr, PA-C 11/26/14 [place], MD 11/26/14 [phone]

## 2015-06-11 ENCOUNTER — Encounter: Payer: Self-pay | Admitting: Neurology

## 2015-06-24 ENCOUNTER — Ambulatory Visit (INDEPENDENT_AMBULATORY_CARE_PROVIDER_SITE_OTHER): Payer: Medicare Other | Admitting: Neurology

## 2015-06-24 ENCOUNTER — Encounter: Payer: Self-pay | Admitting: Neurology

## 2015-06-24 VITALS — BP 164/100 | HR 92

## 2015-06-24 DIAGNOSIS — G903 Multi-system degeneration of the autonomic nervous system: Secondary | ICD-10-CM

## 2015-06-24 DIAGNOSIS — K117 Disturbances of salivary secretion: Secondary | ICD-10-CM | POA: Diagnosis not present

## 2015-06-24 DIAGNOSIS — F458 Other somatoform disorders: Secondary | ICD-10-CM

## 2015-06-24 DIAGNOSIS — R1319 Other dysphagia: Secondary | ICD-10-CM

## 2015-06-24 DIAGNOSIS — G20 Parkinson's disease: Secondary | ICD-10-CM | POA: Diagnosis not present

## 2015-06-24 DIAGNOSIS — G4700 Insomnia, unspecified: Secondary | ICD-10-CM

## 2015-06-24 DIAGNOSIS — G4752 REM sleep behavior disorder: Secondary | ICD-10-CM

## 2015-06-24 NOTE — Patient Instructions (Signed)
1. We have scheduled you at [facility] for your modified barium swallow on 07/04/2015 at 1:00 pm. Please arrive 15 minutes prior and go to 1st floor radiology. If you need to reschedule for any reason please call [phone]. 2. Start Melatonin 3 mg at bedtime.  3. Order given to PT/OT/ST

## 2015-06-24 NOTE — Progress Notes (Signed)
[name] [name] was seen today in the movement disorders clinic.   The patient presents for consultation today, accompanied by his wife who supplements the history.  I have reviewed numerous records are available to me.  The patient has seen multiple neurologists over the years, including Dr. Brett Fairy, Dr. Erling Cruz, Dr. Rexene Alberts, Dr. Janann Colonel, and most recently Dr. Roque Lias.  The patient was first diagnosed with Parkinson's disease in 2004.  I do not have any of the records from his initial diagnosis with Dr. Erling Cruz.  I do have some of his records from Dr. Janann Colonel, Rexene Alberts and Dr. [name].  The patient's Parkinson's disease has been complicated by memory change, side effects from medication and orthostatic hypotension.  The patient is currently living at [facility] and has 24 hours per day care.  He was last seen by Dr. [name] on 06/02/2015.  At that visit his carbidopa/levodopa 25/100 was increased to 2 tablets (from 1-1/2 tablets) at 8 AM/11 AM/2 PM/5 PM.  However, when I looked at his nursing home [facility], it looks like the patient was getting 2 tablets at 8 AM, 11 AM, 2 PM and 8 PM.  This was recently corrected, however.  He thinks that it initially helped some but not sure that it is really helping overall.  He states that he can tell when it wears off, however, as he will have back pain and will have hypophonic speech and will "withdrawl" some.   He was on Requip in the past but this was discontinued because of issues with gambling.  He was on Klonopin previously for REM behavior disorder, but this was also discontinued, but pt not sure why as they deny memory issues.  Records also indicate that he was previously on entacapone.  I do not know when or why this was discontinued.  They state that it was not helpful and it was costly.   He has been on multiple medications for memory in the past including Exelon, Aricept, galantamine, Namenda and the Exelon patch.  All of these cause nausea and perceived cognitive  change.  Interestingly, they both deny memory issues today and state that it is good.   When he was seeing Dr. Janann Colonel last in 2015, he was on Florinef, 0.3 mg for orthostatic hypotension.  He remains on that medication, but is only on 0.2 mg now.  Pt and his wife state that he falls when his BP fluctuates.     Specific Symptoms:  Tremor: No. Voice: yes, last voice therapy x 1.5 years Sleep: trouble sleeping, did well with klonopin but d/c for ? Reason, now on remeron for sleep  Vivid Dreams:  Yes.    Acting out dreams:  No. Wet Pillows: No. Postural symptoms:  Yes.    Falls?  Yes.   (rarely now with merry walker - had that x 1.5 years; tends to fall backwards now) Bradykinesia symptoms: slow movements, drooling while awake and difficulty getting out of a chair Loss of smell:  Yes.   Loss of taste:  Yes.   Urinary Incontinence:  Yes.   (wears depends and pad on mattress) Difficulty Swallowing:  Yes.   (choking on foods; last swallow study x 1.5 years ago; choked on sausage 4-5 months ago and had to have heimlich) Handwriting, micrographia: Yes.   Trouble with ADL's:  Yes.    Trouble buttoning clothing: Yes.   Depression:  Yes.   Memory changes:  Yes.   (some short term loss per pt but  wife denies any loss) Hallucinations:  rarely  visual distortions: No. N/V:  No. Lightheaded:  Yes.   (when BP drops) Diplopia:  Yes.  , that they attribute to multiple retinal surgeries (3); diplopia started prior to the diagnosis of Parkinson's Dyskinesia:  No.   ALLERGIES:   Allergies  Allergen Reactions  . Aricept [Donepezil Hcl] Other (See Comments)    Confusion  . Codeine Other (See Comments)    Per MAR  . Exelon [Rivastigmine Tartrate] Other (See Comments)    unknown  . Namenda [Memantine Hcl] Other (See Comments)    Urinary incontinence   . Diphenhydramine Other (See Comments)    Unknown     CURRENT MEDICATIONS:  Outpatient Encounter Prescriptions as of 06/24/2015  Medication Sig    . acetaminophen (TYLENOL) 325 MG tablet Take 2 tablets (650 mg total) by mouth every 6 (six) hours as needed. (Patient taking differently: Take 650 mg by mouth every 6 (six) hours as needed (for pain). )  . albuterol (PROVENTIL HFA;VENTOLIN HFA) 108 (90 BASE) MCG/ACT inhaler Inhale 2 puffs into the lungs every 4 (four) hours as needed for wheezing or shortness of breath.   Marland Kitchen aspirin EC 81 MG tablet Take 81 mg by mouth every morning.   . calcium carbonate (TUMS - DOSED IN MG ELEMENTAL CALCIUM) 500 MG chewable tablet Chew 1 tablet by mouth daily with breakfast.  . carbidopa-levodopa (SINEMET IR) 25-100 MG per tablet 1.5 tabs 5x a day at 7am, 10am, 1pm, 4pm and 7pm (Patient taking differently: Take 1.5 tablets by mouth 4 (four) times daily. Takes at 0800, 1100, 1400, 1700)  . cholecalciferol (VITAMIN D) 1000 UNITS tablet Take 2,000 Units by mouth every morning.   . fludrocortisone (FLORINEF) 0.1 MG tablet Take 0.3 mg by mouth every morning.   Marland Kitchen GATORADE, BH, Take 8 oz by mouth daily with breakfast.  . ibuprofen (ADVIL,MOTRIN) 200 MG tablet Take 400 mg by mouth every 4 (four) hours as needed (for joint pain). Offer first before Tramadol  . latanoprost (XALATAN) 0.005 % ophthalmic solution Place 1 drop into both eyes every other day.   . loperamide (IMODIUM) 2 MG capsule Take 2 mg by mouth as needed for diarrhea or loose stools.  . menthol-cetylpyridinium (CEPACOL) 3 MG lozenge Take 1 lozenge by mouth 4 (four) times daily.  . minocycline (MINOCIN,DYNACIN) 100 MG capsule Take 100 mg by mouth every 12 (twelve) hours.   . mirabegron ER (MYRBETRIQ) 50 MG TB24 tablet Take 50 mg by mouth at bedtime.   . mirtazapine (REMERON) 15 MG tablet Take 15 mg by mouth at bedtime.   . Pramox-PE-Glycerin-Petrolatum (PREPARATION H) 1-0.25-14.4-15 % CREA Place 1 application rectally 3 (three) times daily with meals as needed (when hemorrhoids present).  . sertraline (ZOLOFT) 100 MG tablet Take 100 mg by mouth every morning.    . traMADol (ULTRAM) 50 MG tablet Take 50 mg by mouth every 6 (six) hours as needed (for pain).   . [DISCONTINUED] guaifenesin (HUMIBID E) 400 MG TABS tablet Take 400 mg by mouth 2 (two) times daily.   No facility-administered encounter medications on file as of 06/24/2015.    PAST MEDICAL HISTORY:   Past Medical History  Diagnosis Date  . Scoliosis   . Heart disease   . Hypertrophy (benign) of prostate   . Asthma   . Hypercholesteremia   . Depression   . Parkinson disease   . Hypertension   . Glaucoma     PAST SURGICAL HISTORY:  Past Surgical History  Procedure Laterality Date  . Refractive surgery Bilateral     2 on OD, 1 on OS  . Mitral valve repair N/A   . Cardiac catheterization  2001    SOCIAL HISTORY:   Social History   Social History  . Marital Status: Married    Spouse Name: Arleen  . Number of Children: 1  . Years of Education: college   Occupational History  . Not on file.   Social History Main Topics  . Smoking status: Never Smoker   . Smokeless tobacco: Never Used  . Alcohol Use: No  . Drug Use: No  . Sexual Activity: Not on file   Other Topics Concern  . Not on file   Social History Narrative   Patient lives at home with his wife [name])   Patient is right handed    Education college   Caffeine consumption once daily          FAMILY HISTORY:   Family Status  Relation Status Death Age  . Mother Deceased   . Father Deceased     depression    ROS:  A complete 10 system review of systems was obtained and was unremarkable apart from what is mentioned above.  PHYSICAL EXAMINATION:    VITALS:   Filed Vitals:   06/24/15 1442  BP: 164/100  Pulse: 92    GEN:  The patient appears stated age and is in NAD. HEENT:  Normocephalic, atraumatic.  The mucous membranes are moist. The superficial temporal arteries are without ropiness or tenderness. CV:  RRR Lungs:  CTAB Neck/HEME:  There are no carotid bruits  bilaterally.  Neurological examination:  Orientation: The patient is alert and oriented x3. Fund of knowledge is appropriate.  Recent and remote memory are intact.  Attention and concentration are normal.    Able to name objects and repeat phrases. Cranial nerves: There is good facial symmetry.  There is facial hypomimia.  This is significant.  Pupils are equal round and reactive to light bilaterally. Fundoscopic exam is attempted but the disc margins are not well visualized bilaterally.  There is upgaze paresis.  The patient has limited peripheral vision with confrontational visual field testing. The speech is fluent and clear.  He is slightly pseudobulbar and slightly hypophonic.  Soft palate rises symmetrically and there is no tongue deviation. Hearing is intact to conversational tone. Sensation: Sensation is intact to light and pinprick throughout (facial, trunk, extremities). Vibration is decreased at the bilateral big toe. There is no extinction with double simultaneous stimulation. There is no sensory dermatomal level identified. Motor: Strength is 5/5 in the bilateral upper and lower extremities.  However, the patient is very apraxic when asked to do motor commands and it may take multiple commands before he understands.  Shoulder shrug is equal and symmetric.  There is no pronator drift. Deep tendon reflexes: Deep tendon reflexes are 3/4 at the bilateral biceps, triceps, brachioradialis, patella and achilles.  There is a striatal toe on the right.  Plantar response is downgoing on the left.  Movement examination: Tone: There is normal tone in the bilateral upper extremities.  The tone in the lower extremities is normal.  Abnormal movements: None (the patient appears to have a tremor of the right lower extremity, but when questioned the patient states that it is purposeful movement) Coordination:  There is significant decremation with RAM's, with virtually all forms of rapid alternating  movement, left greater than right and upper extremity more  than lower extremity Gait and Station: The patient has some difficulty arising out of the wheelchair.  He pushes off.  Once up, he enters into a special type of walker called a Darrick Grinder.  Without device, he is actually able to walk fairly well.     ASSESSMENT/PLAN:  1.  Parkinsonism  -Long discussion with the patient and his family today.   Much greater than 50% of this 70 minute visit was spent in counseling.  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  I saw that doctor [name] questioned the diagnosis of idiopathic Parkinson's disease years ago, although records more recently indicate the diagnosis of Parkinson's disease.  It is difficult as the patient reports he has had multiple retinal surgeries as well and reports diplopia as a consequence, but have to wonder if diplopia is not a consequence of PSP.  I told the patient that prognosis is different, but treatment is the same.  -For now, he will continue on carbidopa/levodopa 25/100, 2 tablets at 8 AM/11 AM/2 PM/5 PM.   -I gave him a prescription to have PT/OT/ST at [facility] where he now lives. 2.  Orthostatic hypotension  -He has been on Florinef for several years because of this.  Again, I am not sure when this was started, which may be helpful in the diagnosis of idiopathic Parkinson's disease versus one of the atypical states.  I do know that he used to be on 0.3 mg and is now on 0.2 mg of Florinef.  He seems to be doing fairly well with this medication and he was sent with a long list of blood pressures taken twice per day and they were all within acceptable limits. 3.  Dysphagia  -He has not had a modified barium swallow in approximately one year and a half according to the patient and his wife.  He had a serious choking episode several months ago in which he required a  Heimlich maneuver.  I am going to order a modified barium swallow. 4.  Sialorrhea  -Overall, this is fairly mild.  We did talk about the use of Botox if this gets worse. 5.  Insomnia with RBD  -He used to be on clonazepam, but was taken off of this for some reason.  I presume that he was taken off of it because of memory issues, but I am not sure.  He is now on mirtazapine.  He reports that this does not work as well for the insomnia.  I asked him to add melatonin, 3 mg at night to his current mirtazapine. 6.  I will plan on seeing the patient back in the next few months, sooner should new neurologic issues arise.

## 2015-06-25 ENCOUNTER — Other Ambulatory Visit (HOSPITAL_COMMUNITY): Payer: Self-pay | Admitting: Internal Medicine

## 2015-06-25 DIAGNOSIS — R1314 Dysphagia, pharyngoesophageal phase: Secondary | ICD-10-CM

## 2015-07-01 ENCOUNTER — Telehealth: Payer: Self-pay | Admitting: Neurology

## 2015-07-01 NOTE — Telephone Encounter (Signed)
Lacie from [facility] [employer] called in regards to PT and a referral they received/Dawn CB# [phone]

## 2015-07-01 NOTE — Telephone Encounter (Signed)
Confirmed with Lacie that patient was to have PT/OT/ST at [facility] - not off site.

## 2015-07-04 ENCOUNTER — Ambulatory Visit (HOSPITAL_COMMUNITY)
Admission: RE | Admit: 2015-07-04 | Discharge: 2015-07-04 | Disposition: A | Discharge status: Home / Self Care | Payer: Medicare Other | Source: Ambulatory Visit | Attending: Internal Medicine | Admitting: Internal Medicine

## 2015-07-04 DIAGNOSIS — G20 Parkinson's disease: Secondary | ICD-10-CM | POA: Diagnosis present

## 2015-07-04 DIAGNOSIS — R1319 Other dysphagia: Secondary | ICD-10-CM

## 2015-07-04 DIAGNOSIS — F458 Other somatoform disorders: Secondary | ICD-10-CM | POA: Diagnosis not present

## 2015-07-04 DIAGNOSIS — R1314 Dysphagia, pharyngoesophageal phase: Secondary | ICD-10-CM

## 2015-07-04 NOTE — Procedures (Signed)
Objective Swallowing Evaluation: Other (Comment)  Patient Details  Name: [name] [name] MRN: [id number] Date of Birth: [birth year]  Today's Date: 07/04/2015 Time: No Data Recorded-No Data Recorded No Data Recorded  Past Medical History:  Past Medical History  Diagnosis Date  . Scoliosis   . Heart disease   . Hypertrophy (benign) of prostate   . Asthma   . Hypercholesteremia   . Depression   . Parkinson disease   . Hypertension   . Glaucoma    Past Surgical History:  Past Surgical History  Procedure Laterality Date  . Refractive surgery Bilateral     2 on OD, 1 on OS  . Mitral valve repair N/A   . Cardiac catheterization  2001  . Hernia repair     HPI:  Other Pertinent Information: 74 yo male referred by Dr Tat for MBS.  Pt has h/o of Parkinson disease diagnosed in 2004.   HIs neurological disease has been complicated by memory change, side effects from medication and orthostatic hypotension.  Pt medication list includes carbidopa/levodopa, medication has been adjusted to maximize pt's motor function.  Pt with frequent falls per wife report to neurologist when his blood pressure fluctuation.  PMH + asthma, scoliosis, heart disease, hypercholesteremia, BPH, depression, HTN, glaucoma.  Pt with h/o mitral valve repair and cardiac cath.  Pt reports he has some problems swallowing on irregular basis.  He had required heimlich maneuver from choking on sausage approx 8 months ago per pt/wife.  Pt denies pneumonias nor unintentional weight loss.  Repeat MBS to compare current ability to prior testing Oct 2014.    No Data Recorded  Assessment / Plan / Recommendation CHL IP CLINICAL IMPRESSIONS 07/04/2015  Therapy Diagnosis Mild oral phase dysphagia;Mild pharyngeal phase dysphagia;Other (Comment)  Clinical Impression   Pt presents with mild oropharyngeal dysphagia with sensorimotor deficits.  Decreased oral propulsion strength noted. Premature spillage of pudding, nectar into pharynx noted due  to decreased oral control.  Pharyngeal swallow characterized by decreased tongue base retraction, epiglottic deflection and laryngeal elevation resulting in pharyngeal residuals across all consistencies but worse with increased viscocity.  With cracker bolus, pt stated "I swallowed" when he had not and cracker was retained in pharynx - near vallecular region before pt swallowed - indicating delay with poor pt awareness.  Pt also does NOT sense residuals - however following solids with liquids help to decrease residuals.  No aspiration or penetration observed however pt is at risk due to his progressive neurological disease and dysphagia.    Pt was observed to clear his throat frequently before and during intake - he reports this to be chronic. No drooling noted today.  SLP assisted pt to take his parkinson's medicine before MBS, using applesauce and water.     Using live video, educated pt to findings.  Provided written compensation strategies for pt, spouse and caregiver.  Encouraged pt to maintain tongue base and phonation/diaphragm strength.  Also encouraged pt to "hock" to help clear pharyngeal residuals as needed.  Continue exercises and follow up with SLP recommended.  Answered pt and spouse questions and using teach back, reinforced education.          CHL IP TREATMENT RECOMMENDATION 07/04/2015  Treatment Recommendations Other (Comment)     CHL IP DIET RECOMMENDATION 07/04/2015  SLP Diet Recommendations Age appropriate regular solids- prefer softer foods, Thin  Liquid Administration via Cup, straw  Medication Administration Whole meds with puree  Compensations Small sips/bites;Slow rate;Follow solids with liquid, intermittent dry swallow, start  intake with liquids  Postural Changes and/or Swallow Maneuvers Stay upright after meals due to h/o known esophageal dysmotility     CHL IP OTHER RECOMMENDATIONS 07/04/2015  Recommended Consults (None)  Oral Care Recommendations Oral care BID  Other  Recommendations (None)     CHL IP FOLLOW UP RECOMMENDATIONS 08/15/2013  Follow up Recommendations Outpatient SLP         CHL IP REASON FOR REFERRAL 07/04/2015  Reason for Referral Objectively evaluate swallowing function     CHL IP ORAL PHASE 07/04/2015  Oral Phase Impaired      CHL IP PHARYNGEAL PHASE 07/04/2015  Pharyngeal Phase Impaired  Pharyngeal Comment dry swallows, following solids with liquids      CHL IP CERVICAL ESOPHAGEAL PHASE 07/04/2015  Cervical Esophageal Phase Impaired  Cervical Esophageal Comment appearance of mild residuals after swallow at pyriform with liquids    CHL IP GO 07/04/2015  Functional Assessment Tool Used MBS, clinical judgement  Functional Limitations Swallowing  Swallow Current Status ([id number]) CJ  Swallow Goal Status (U0454) [place] Discharge Status [phone]) Michaell Cowing, [facility] SLP [phone]

## 2015-09-25 ENCOUNTER — Ambulatory Visit (INDEPENDENT_AMBULATORY_CARE_PROVIDER_SITE_OTHER): Payer: Medicare Other | Admitting: Neurology

## 2015-09-25 ENCOUNTER — Encounter: Payer: Self-pay | Admitting: Neurology

## 2015-09-25 VITALS — BP 102/68 | HR 80

## 2015-09-25 DIAGNOSIS — G903 Multi-system degeneration of the autonomic nervous system: Secondary | ICD-10-CM

## 2015-09-25 DIAGNOSIS — R1319 Other dysphagia: Secondary | ICD-10-CM

## 2015-09-25 DIAGNOSIS — F458 Other somatoform disorders: Secondary | ICD-10-CM | POA: Diagnosis not present

## 2015-09-25 DIAGNOSIS — G231 Progressive supranuclear ophthalmoplegia [Steele-Richardson-Olszewski]: Secondary | ICD-10-CM | POA: Diagnosis not present

## 2015-09-25 DIAGNOSIS — G4700 Insomnia, unspecified: Secondary | ICD-10-CM

## 2015-09-25 NOTE — Progress Notes (Signed)
[name] [name] was seen today in the movement disorders clinic.   The patient presents for consultation today, accompanied by his wife who supplements the history.  I have reviewed numerous records are available to me.  The patient has seen multiple neurologists over the years, including Dr. Brett Fairy, Dr. Erling Cruz, Dr. Rexene Alberts, Dr. Janann Colonel, and most recently Dr. Roque Lias.  The patient was first diagnosed with Parkinson's disease in 2004.  I do not have any of the records from his initial diagnosis with Dr. Erling Cruz.  I do have some of his records from Dr. Janann Colonel, Rexene Alberts and Dr. [name].  The patient's Parkinson's disease has been complicated by memory change, side effects from medication and orthostatic hypotension.  The patient is currently living at [facility] and has 24 hours per day care.  He was last seen by Dr. [name] on 06/02/2015.  At that visit his carbidopa/levodopa 25/100 was increased to 2 tablets (from 1-1/2 tablets) at 8 AM/11 AM/2 PM/5 PM.  However, when I looked at his nursing home [facility], it looks like the patient was getting 2 tablets at 8 AM, 11 AM, 2 PM and 8 PM.  This was recently corrected, however.  He thinks that it initially helped some but not sure that it is really helping overall.  He states that he can tell when it wears off, however, as he will have back pain and will have hypophonic speech and will "withdrawl" some.   He was on Requip in the past but this was discontinued because of issues with gambling.  He was on Klonopin previously for REM behavior disorder, but this was also discontinued, but pt not sure why as they deny memory issues.  Records also indicate that he was previously on entacapone.  I do not know when or why this was discontinued.  They state that it was not helpful and it was costly.   He has been on multiple medications for memory in the past including Exelon, Aricept, galantamine, Namenda and the Exelon patch.  All of these cause nausea and perceived cognitive  change.  Interestingly, they both deny memory issues today and state that it is good.   When he was seeing Dr. Janann Colonel last in 2015, he was on Florinef, 0.3 mg for orthostatic hypotension.  He remains on that medication, but is only on 0.2 mg now.  Pt and his wife state that he falls when his BP fluctuates.    09/25/15 update:  The patient is following up today, accompanied by his wife who supplements the history.  The patient is on carbidopa/levodopa 25/100, 2 tablets at 8 AM/11 AM/2 PM/5 PM.  I ordered physical therapy/occupational therapy/speech therapy at [facility] since our last visit and he has been attending those.  Therapy ended 2 weeks ago and it was very helpful.  He did get some home exercises to do and he has done some of those.  He states that they didn't do speech therapy but sounds but sounds like he had swallow therapy.  He does feel this helped.  He remains on Florinef, 0.2 mg for orthostatic hypotension.  He has had no syncopal episodes.  He had a modified barium swallow on 07/04/2015 in regards to his dysphagia.  There was mild oral and pharyngeal phase dysphagia.  A regular diet with soft foods and thin liquids was recommended.  Medications could be administered whole with pure.  He is following this diet but is still getting choked some.   The patient  denies any falls since last visit.  No hallucinations.  He and his wife are planning on moving to [place] where their son is.  When I asked him about advanced directives, he stated that he had some and I asked him specifics about them and he stated he did not know.  He continues to have significant diplopia.  He relates that to previous retinal surgeries.  ALLERGIES:   Allergies  Allergen Reactions  . Aricept [Donepezil Hcl] Other (See Comments)    Confusion  . Codeine Other (See Comments)    Per MAR  . Exelon [Rivastigmine Tartrate] Other (See Comments)    unknown  . Namenda [Memantine Hcl] Other (See Comments)    Urinary  incontinence   . Diphenhydramine Other (See Comments)    Unknown     CURRENT MEDICATIONS:  Outpatient Encounter Prescriptions as of 09/25/2015  Medication Sig  . acetaminophen (TYLENOL) 325 MG tablet Take 2 tablets (650 mg total) by mouth every 6 (six) hours as needed. (Patient taking differently: Take 650 mg by mouth every 6 (six) hours as needed (for pain). )  . albuterol (PROVENTIL HFA;VENTOLIN HFA) 108 (90 BASE) MCG/ACT inhaler Inhale 2 puffs into the lungs every 4 (four) hours as needed for wheezing or shortness of breath.   . carbidopa-levodopa (SINEMET IR) 25-100 MG per tablet 1.5 tabs 5x a day at 7am, 10am, 1pm, 4pm and 7pm (Patient taking differently: Take 2 tablets by mouth 4 (four) times daily. Takes at 0800, 1100, 1400, 1700)  . cholecalciferol (VITAMIN D) 1000 UNITS tablet Take 2,000 Units by mouth every morning.   . fludrocortisone (FLORINEF) 0.1 MG tablet Take 0.2 mg by mouth every morning.   Marland Kitchen GATORADE, BH, Take 8 oz by mouth daily with breakfast.  . ibuprofen (ADVIL,MOTRIN) 200 MG tablet Take 400 mg by mouth every 4 (four) hours as needed (for joint pain). Offer first before Tramadol  . latanoprost (XALATAN) 0.005 % ophthalmic solution Place 1 drop into both eyes every other day.   . loperamide (IMODIUM) 2 MG capsule Take 2 mg by mouth as needed for diarrhea or loose stools.  . Melatonin 1 MG CAPS Take 3 mg by mouth at bedtime.  . minocycline (MINOCIN,DYNACIN) 100 MG capsule Take 100 mg by mouth every 12 (twelve) hours.   . mirtazapine (REMERON) 15 MG tablet Take 15 mg by mouth at bedtime.   . Pramox-PE-Glycerin-Petrolatum (PREPARATION H) 1-0.25-14.4-15 % CREA Place 1 application rectally 3 (three) times daily with meals as needed (when hemorrhoids present).  . sertraline (ZOLOFT) 100 MG tablet Take 100 mg by mouth every morning.   Marland Kitchen SPIRIVA RESPIMAT 1.25 MCG/ACT AERS   . Witch Hazel (TUCKS) 50 % PADS Apply topically.  . [DISCONTINUED] calcium carbonate (TUMS - DOSED IN MG  ELEMENTAL CALCIUM) 500 MG chewable tablet Chew 1 tablet by mouth daily with breakfast.  . [DISCONTINUED] dextromethorphan-guaiFENesin (MUCINEX DM) 30-600 MG per 12 hr tablet Take 1 tablet by mouth 2 (two) times daily.  . [DISCONTINUED] menthol-cetylpyridinium (CEPACOL) 3 MG lozenge Take 1 lozenge by mouth 4 (four) times daily.  . [DISCONTINUED] traMADol (ULTRAM) 50 MG tablet Take 50 mg by mouth every 6 (six) hours as needed (for pain).    No facility-administered encounter medications on file as of 09/25/2015.    PAST MEDICAL HISTORY:   Past Medical History  Diagnosis Date  . Scoliosis   . Heart disease   . Hypertrophy (benign) of prostate   . Asthma   . Hypercholesteremia   .  Depression   . Parkinson disease ([place])   . Hypertension   . Glaucoma     PAST SURGICAL HISTORY:   Past Surgical History  Procedure Laterality Date  . Refractive surgery Bilateral     2 on OD, 1 on OS  . Mitral valve repair N/A   . Cardiac catheterization  2001  . Hernia repair      SOCIAL HISTORY:   Social History   Social History  . Marital Status: Married    Spouse Name: Arleen  . Number of Children: 1  . Years of Education: college   Occupational History  . Not on file.   Social History Main Topics  . Smoking status: Never Smoker   . Smokeless tobacco: Never Used  . Alcohol Use: No  . Drug Use: No  . Sexual Activity: Not on file   Other Topics Concern  . Not on file   Social History Narrative   Patient lives at home with his wife [name])   Patient is right handed    Education college   Caffeine consumption once daily          FAMILY HISTORY:   Family Status  Relation Status Death Age  . Mother Deceased     Stroke  . Father Deceased     depression  . Son Alive     healthy    ROS:  A complete 10 system review of systems was obtained and was unremarkable apart from what is mentioned above.  PHYSICAL EXAMINATION:    VITALS:   Filed Vitals:   09/25/15 1428  BP:  102/68  Pulse: 80    GEN:  The patient appears stated age and is in NAD. HEENT:  Normocephalic, atraumatic.  The mucous membranes are moist. The superficial temporal arteries are without ropiness or tenderness. CV:  RRR Lungs:  CTAB Neck/HEME:  There are no carotid bruits bilaterally.  Neurological examination:  Orientation: The patient is alert and oriented x3.  Cranial nerves: There is good facial symmetry.  There is facial hypomimia.  This is significant.    There is upgaze paresis.  The patient has limited peripheral vision with confrontational visual field testing. The speech is fluent and clear.  His speech is slightly pseudobulbar and slightly hypophonic.  Soft palate rises symmetrically and there is no tongue deviation. Hearing is intact to conversational tone. Sensation: Sensation is intact to light touch throughout. Motor: Strength is 5/5 in the bilateral upper and lower extremities.  However, the patient is very apraxic when asked to do motor commands and it may take multiple commands before he understands.  Shoulder shrug is equal and symmetric.  There is no pronator drift.   Movement examination: Tone: There is normal tone in the bilateral upper extremities.  The tone in the lower extremities is normal.  Abnormal movements: None  Coordination:  There is significant decremation with RAM's, with virtually all forms of rapid alternating movement, left greater than right and upper extremity more than lower extremity Gait and Station: The patient does not have his Darrick Grinder today and therefore we opted not to try to walk.  He is in a wheelchair.     ASSESSMENT/PLAN:  1.  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  The idea of an atypical state was brought up years ago by Dr. [name] and I talked to them about it last visit, but they did not seem to remember and so we talked about at length again  today.  We talked about the fact that it is rather academic  at this point in time and while prognosis is different, treatment is not.  -For now, he will continue on carbidopa/levodopa 25/100, 2 tablets at 8 AM/11 AM/2 PM/5 PM.   -They're planning on moving to [place] to be closer to their son.  The patient will likely live in a memory care unit and his wife will likely lives in independent assisted living.  I gave them the names of movement disorder physicians in [place].  -gave them copy of advanced directive form.  Talked about end-of-life issues.  Talked to him about the importance of letting his physicians know his wishes. 2.  Orthostatic hypotension  -He has been on Florinef for several years because of this.  Again, I am not sure when this was started, which may be helpful in the diagnosis of idiopathic Parkinson's disease versus one of the atypical states.  I do know that he used to be on 0.3 mg and is now on 0.2 mg of Florinef.  He seems to be doing fairly well with this medication and he was sent with a long list of blood pressures taken twice per day and they were all within acceptable limits.  He elevates the head of the bed at night. 3.  Dysphagia  -He had a modified barium swallow on 07/04/2015 in regards to his dysphagia.  There was mild oral and pharyngeal phase dysphagia.  A regular diet with soft foods and thin liquids was recommended 4.  Sialorrhea  -Overall, this is fairly mild.  We did talk about the use of Botox if this gets worse. 5.  Insomnia with RBD  -He used to be on clonazepam, but was taken off of this for some reason.  I presume that he was taken off of it because of memory issues, but I am not sure.  He is now on mirtazapine and melatonin. 6.  I will plan on seeing the patient back in the next few months, if he is still living here.  Much greater than 50% of this visit was spent in counseling with the patient and the family.  Total face to face time:  40 min

## 2015-09-25 NOTE — Patient Instructions (Addendum)
1.  Dr. Dicie Beam at [facility] in [place] is the movement specialist; there is a new movement specialist at [facility] in [place] but I don't know him as well.

## 2015-09-26 NOTE — Progress Notes (Signed)
Note routed to Dr Dillard Essex.

## 2015-09-30 ENCOUNTER — Telehealth: Payer: Self-pay | Admitting: Neurology

## 2015-09-30 NOTE — Telephone Encounter (Signed)
Communication is Key/ called to inform that they aren't excepting adult pt's at this time for speech therapy

## 2015-09-30 NOTE — Telephone Encounter (Signed)
Speech therapy referral was sent to patient's residence - [facility].

## 2015-11-05 ENCOUNTER — Telehealth: Payer: Self-pay | Admitting: Neurology

## 2015-11-05 NOTE — Telephone Encounter (Signed)
Called back and gave them information for Dicie Beam at [facility].

## 2015-11-05 NOTE — Telephone Encounter (Signed)
VM-PT left message saying she was moving to [place] and wanted a recommendation for a neurologist in that area/Dawn CB# [phone]

## 2015-12-09 ENCOUNTER — Encounter: Payer: Self-pay | Admitting: Interventional Cardiology

## 2015-12-09 ENCOUNTER — Ambulatory Visit (INDEPENDENT_AMBULATORY_CARE_PROVIDER_SITE_OTHER): Payer: Medicare Other | Admitting: Interventional Cardiology

## 2015-12-09 VITALS — BP 170/100 | HR 75 | Ht 68.0 in

## 2015-12-09 DIAGNOSIS — I951 Orthostatic hypotension: Secondary | ICD-10-CM | POA: Diagnosis not present

## 2015-12-09 DIAGNOSIS — I059 Rheumatic mitral valve disease, unspecified: Secondary | ICD-10-CM

## 2015-12-09 DIAGNOSIS — E785 Hyperlipidemia, unspecified: Secondary | ICD-10-CM

## 2015-12-09 NOTE — Progress Notes (Signed)
Patient ID: [name] [name], male   DOB: 04-13-1941, 75 y.o.   MRN: [id number]     Cardiology Office Note   Date:  12/09/2015   ID:  [name] [name], DOB Mar 11, 1941, MRN [id number]  PCP:  [name] Shelling, MD    Chief Complaint  Patient presents with  . Annual Exam    no sx   Follow-up mitral valve replacement  Wt Readings from Last 3 Encounters:  07/05/14 142 lb (64.411 kg)  06/21/14 147 lb (66.679 kg)  03/20/14 147 lb (66.679 kg)       History of Present Illness: [name] [name] is a 75 y.o. male   who has h/o MVR and Parkinsons. He has been drinking more fluid to try to help treat orthostatic hypotension. No recent falls. Flomax worsened his orthostatic issues in the past. Still on fluorinef. He is now in an assisted living facility. Mitral Valve Regurgitation:  dry cough, swallowing problems. No orthopnea or PND. No palpitations.  He has declined physically.  He will be moving to [place] to be closer to family.      Past Medical History  Diagnosis Date  . Scoliosis   . Heart disease   . Hypertrophy (benign) of prostate   . Asthma   . Hypercholesteremia   . Depression   . Parkinson disease ([name] [name])   . Hypertension   . Glaucoma     Past Surgical History  Procedure Laterality Date  . Refractive surgery Bilateral     2 on OD, 1 on OS  . Mitral valve repair N/A   . Cardiac catheterization  2001  . Hernia repair       Current Outpatient Prescriptions  Medication Sig Dispense Refill  . ADVAIR HFA 115-21 MCG/ACT inhaler Inhale 2 puffs into the lungs daily as needed. For wheezing & SOB    . albuterol (PROVENTIL HFA;VENTOLIN HFA) 108 (90 BASE) MCG/ACT inhaler Inhale 2 puffs into the lungs every 4 (four) hours as needed for wheezing or shortness of breath.     . calcium-vitamin D (CALCIUM 500/D) 500-200 MG-UNIT tablet Take 1 tablet by mouth daily.    . cholecalciferol (VITAMIN D) 1000 UNITS tablet Take 2,000 Units by mouth every morning.     Marland Kitchen Dextromethorphan HBr  (ROBAFEN COUGH PO) Take 10 mLs by mouth 3 (three) times daily.    . fludrocortisone (FLORINEF) 0.1 MG tablet Take 0.1 mg by mouth every morning.     Marland Kitchen GATORADE, BH, Take 8 oz by mouth daily with breakfast.    . ibuprofen (ADVIL,MOTRIN) 200 MG tablet Take 400 mg by mouth every 4 (four) hours as needed (for joint pain). Offer first before Tramadol    . latanoprost (XALATAN) 0.005 % ophthalmic solution Place 1 drop into both eyes every other day.     . loperamide (IMODIUM) 2 MG capsule Take 2 mg by mouth as needed for diarrhea or loose stools.    . Melatonin 1 MG CAPS Take 3 mg by mouth at bedtime.    . minocycline (MINOCIN,DYNACIN) 50 MG capsule Take 50 mg by mouth daily.    . mirtazapine (REMERON) 15 MG tablet Take 15 mg by mouth at bedtime.     . potassium chloride (KLOR-CON M10) 10 MEQ tablet Take 1 tablet by mouth daily.    . sertraline (ZOLOFT) 100 MG tablet Take 100 mg by mouth every morning.     Marland Kitchen witch hazel-glycerin (TUCKS) pad Apply 1 application topically daily as needed for itching or hemorrhoids.  No current facility-administered medications for this visit.    Allergies:   Aricept; Codeine; Exelon; Namenda; and Diphenhydramine    Social History:  The patient  reports that he has never smoked. He has never used smokeless tobacco. He reports that he does not drink alcohol or use illicit drugs.   Family History:  The patient's family history includes CVA in his mother; Depression in his father.    ROS:  Please see the history of present illness.   Otherwise, review of systems are positive for generalized weakness, swallowing problems.   All other systems are reviewed and negative.    PHYSICAL EXAM: VS:  BP 170/100 mmHg  Pulse 75  Ht 5\' 8"  (1.727 m)  Wt  , BMI There is no weight on file to calculate BMI. GEN: Well nourished, well developed, in no acute distress HEENT: normal Neck: no JVD, carotid bruits, or masses Cardiac: RRR; no murmurs, rubs, or gallops,no edema    Respiratory:  clear to auscultation bilaterally, normal work of breathing GI: soft, nontender, nondistended, + BS MS: no deformity or atrophy Skin: warm and dry, no rash Neuro:  Generally weak, in a wheelchair Psych: flat affect     Recent Labs: No results found for requested labs within last 365 days.   Lipid Panel No results found for: CHOL, TRIG, HDL, CHOLHDL, VLDL, LDLCALC, LDLDIRECT   Other studies Reviewed: Additional studies/ records that were reviewed today with results demonstrating:echocardiogram from 2013 reviewed .   ASSESSMENT AND PLAN:  1. H/o MV replacment : continue to use antibiotics prior to dental cleanings. Last echocardiogram showed normal valvular function. No need for repeat study at this time. 2. Hyperlipidemia: was previously on simvastatin. This does not appear on his med list today. He has had a significant decline in his overall function due to his neurologic issues. Unclear to me whether they chose to stop his statin or whether or not it just fell off his list when he moved into this facility. 3. Orthostatic hypotension: continue to encourage hydration. Continue Florinef which she has been on for some time. Symptoms are manageable. He is not falling, but he is careful to use a walker. 4. I have recommended him to Dr. Sabino Snipes in [place]. The patient will be moving there.   Current medicines are reviewed at length with the patient today.  The patient concerns regarding his medicines were addressed.  The following changes have been made:  No change  Labs/ tests ordered today include:  No orders of the defined types were placed in this encounter.    Recommend 150 minutes/week of aerobic exercise Low fat, low carb, high fiber diet recommended  Disposition:   FU when necessary   Teresita Madura., MD  12/09/2015 3:47 PM    [facility] [address], [place], [place]  [address] Phone: [phone]; Fax:  [phone]

## 2015-12-09 NOTE — Patient Instructions (Addendum)
**Note De-Identified  Obfuscation** Medication Instructions:  Same-no changes  Labwork: None  Testing/Procedures: None   Any Other Special Instructions Will Be Listed Below (If Applicable). Dr Sabino Snipes in [place], [place] at [phone]   If you need a refill on your cardiac medications before your next appointment, please call your pharmacy.

## 2015-12-30 ENCOUNTER — Telehealth: Payer: Self-pay | Admitting: Neurology

## 2015-12-30 NOTE — Telephone Encounter (Signed)
Received request from patient to forward notes to Dr Rosine Beat in [place]. Notes faxed to [phone] with confirmation received.

## 2016-02-03 IMAGING — CR DG RIBS W/ CHEST 3+V*R*
3 series · 3 of 3 positions shown · non-contrast
Comparison: DG ESOPHAGUS-BA SW dated 11/20/2012; DG CHEST 2 VIEW
dated 11/27/2010

CLINICAL DATA: Fall, right rib pain.

EXAM:
RIGHT RIBS AND CHEST - 3+ VIEW

[view not recorded (1 of 3)]
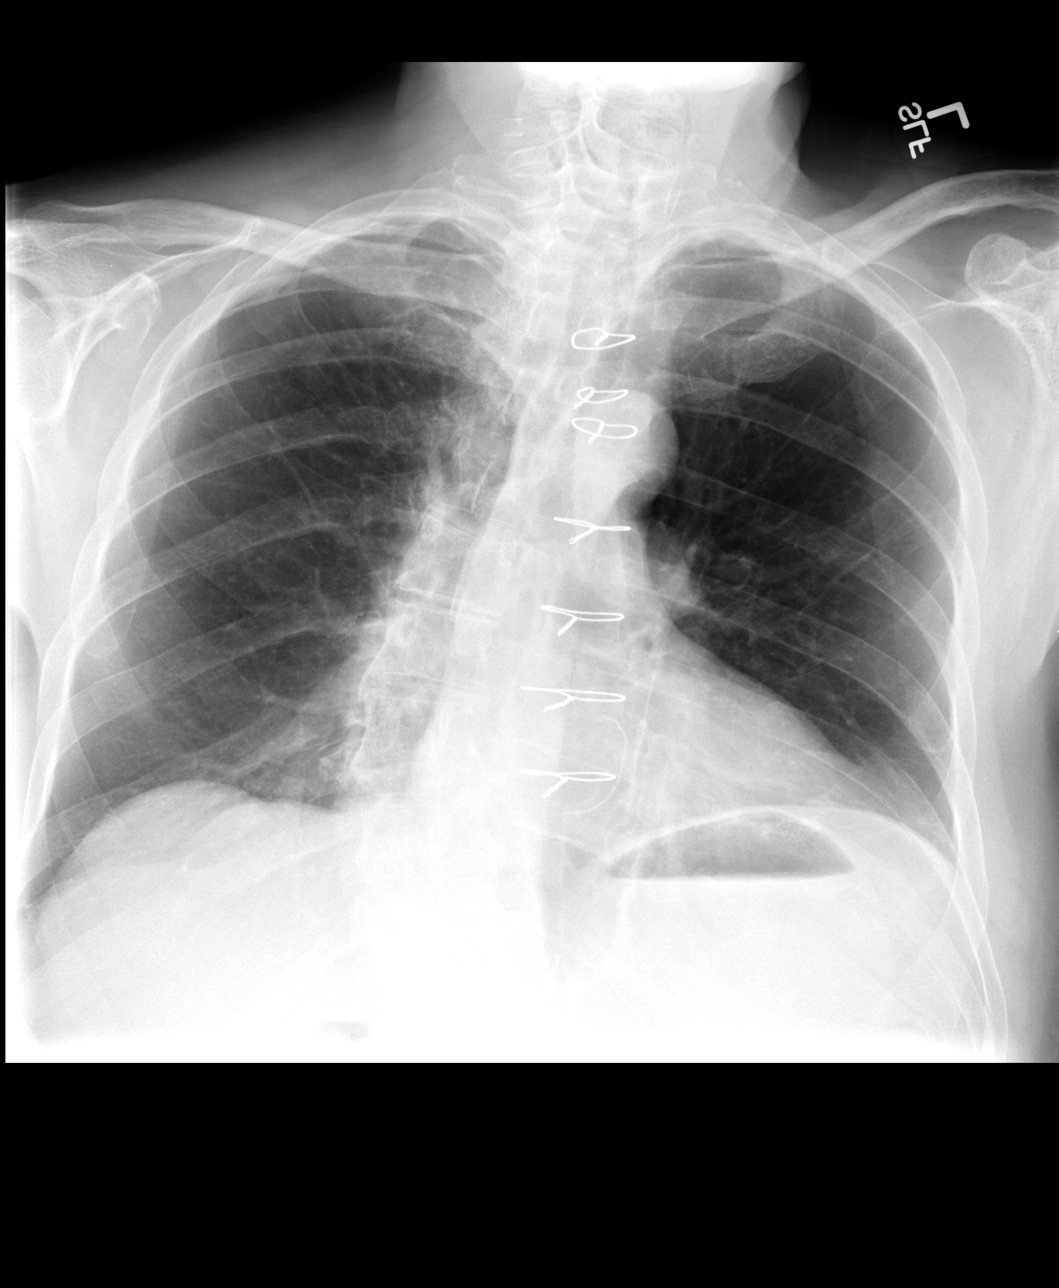

[view not recorded (2 of 3)]
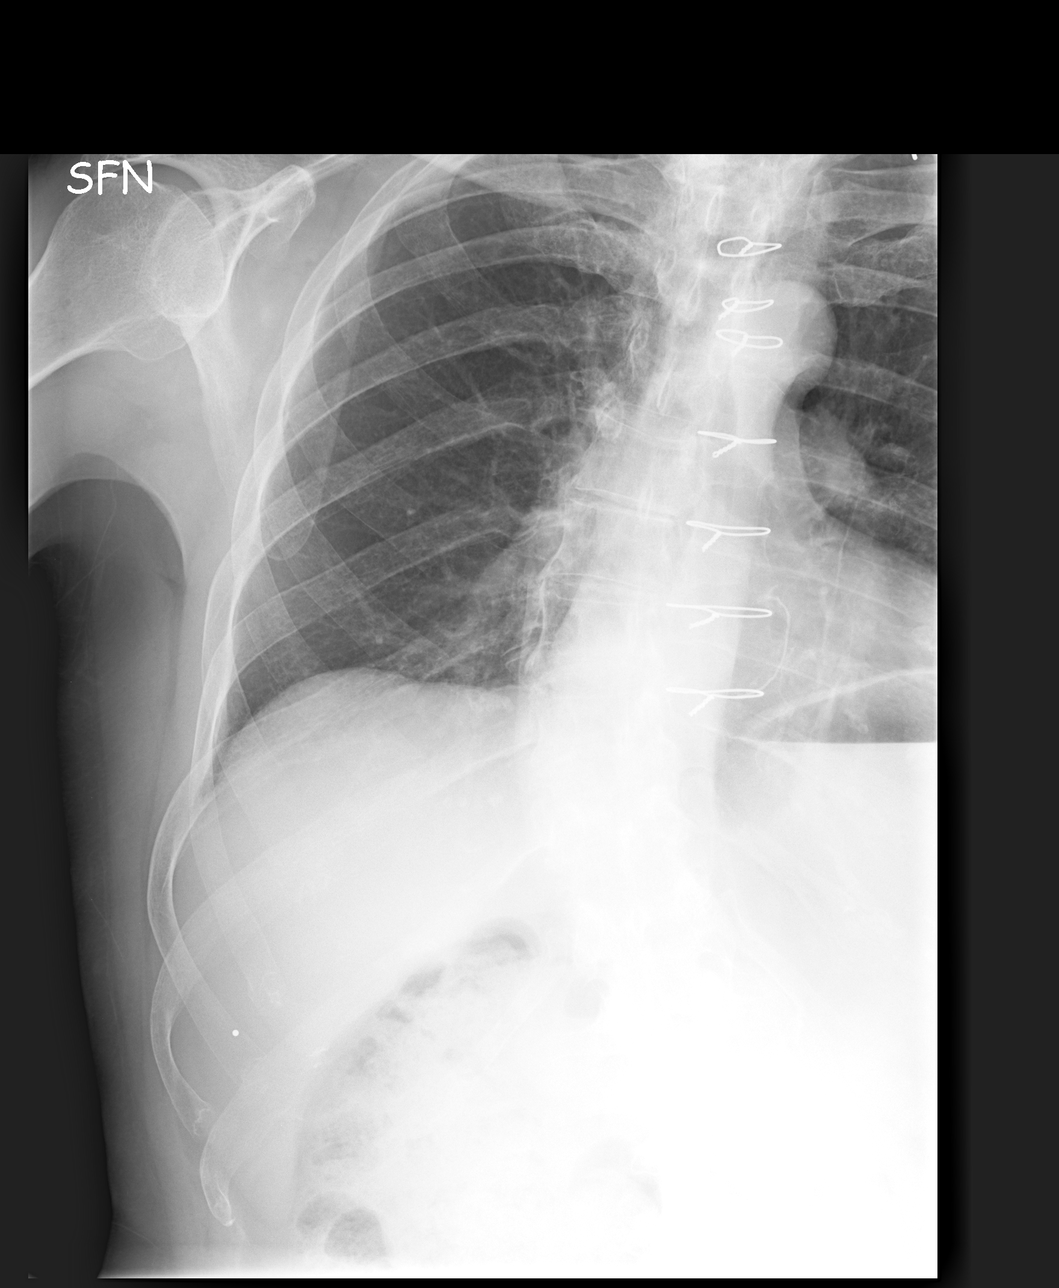

[view not recorded (3 of 3)]
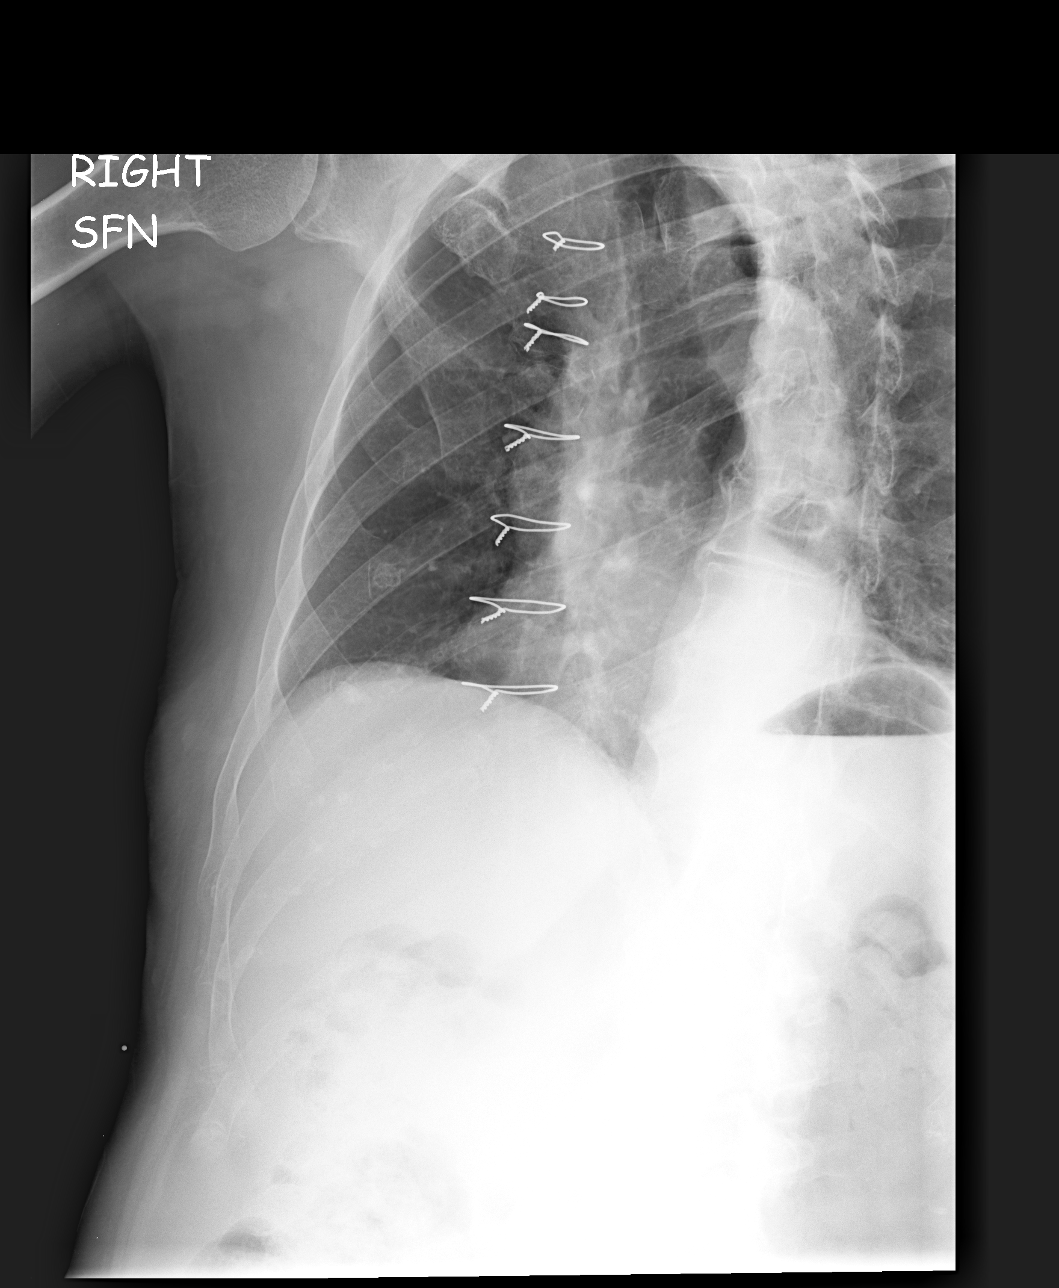

[3 of 3 positions shown; findings below may reference images not displayed]

FINDINGS: Cardiomediastinal silhouette is nonsuspicious, status post median
sternotomy. The lungs are clear without pleural effusions or focal
consolidations. Trachea projects midline and there is no
pneumothorax. Soft tissue planes and included osseous structures are
non-suspicious. No rib fracture deformity. Broad thoracic
dextroscoliosis. Possible surgical clips in right neck could be
external to patient.
IMPRESSION: No acute cardiopulmonary process or rib fracture deformity.

  By: Hahqhwu Manxhuka

## 2016-02-09 ENCOUNTER — Telehealth: Payer: Self-pay | Admitting: Neurology

## 2016-02-09 NOTE — Telephone Encounter (Signed)
LMOM making patient's wife aware I re-faxed office note to the number provided.

## 2016-02-09 NOTE — Telephone Encounter (Signed)
Patient/wife made aware this was done and documented on 12/30/2015.

## 2016-02-09 NOTE — Telephone Encounter (Signed)
Pt needs the information to dr aglert asap they are say they never got anything please fax it to [phone] pt call back number is [phone]

## 2016-02-09 NOTE — Telephone Encounter (Signed)
Pt needs a referral to dr Andee Poles angl[place]  Fax to [phone] pt call back number is [phone]

## 2016-03-18 IMAGING — CR DG HIP (WITH OR WITHOUT PELVIS) 2-3V*L*
3 series · 3 of 3 positions shown · non-contrast
Comparison: None.

CLINICAL DATA: FALL

EXAM:
LEFT HIP - COMPLETE 2+ VIEW

[view not recorded (1 of 3)]
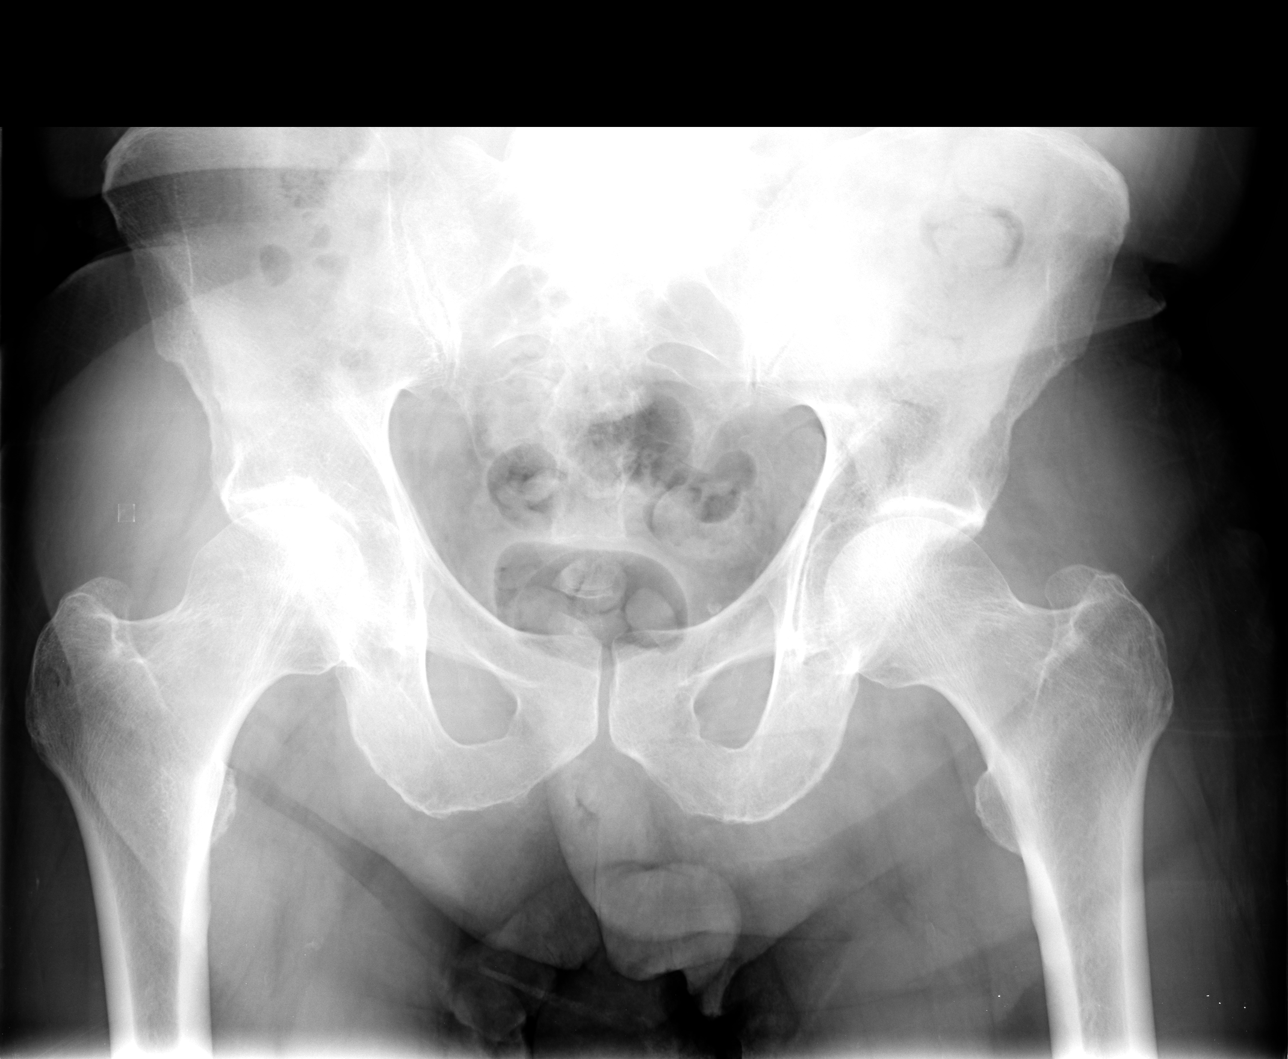

[view not recorded (2 of 3)]
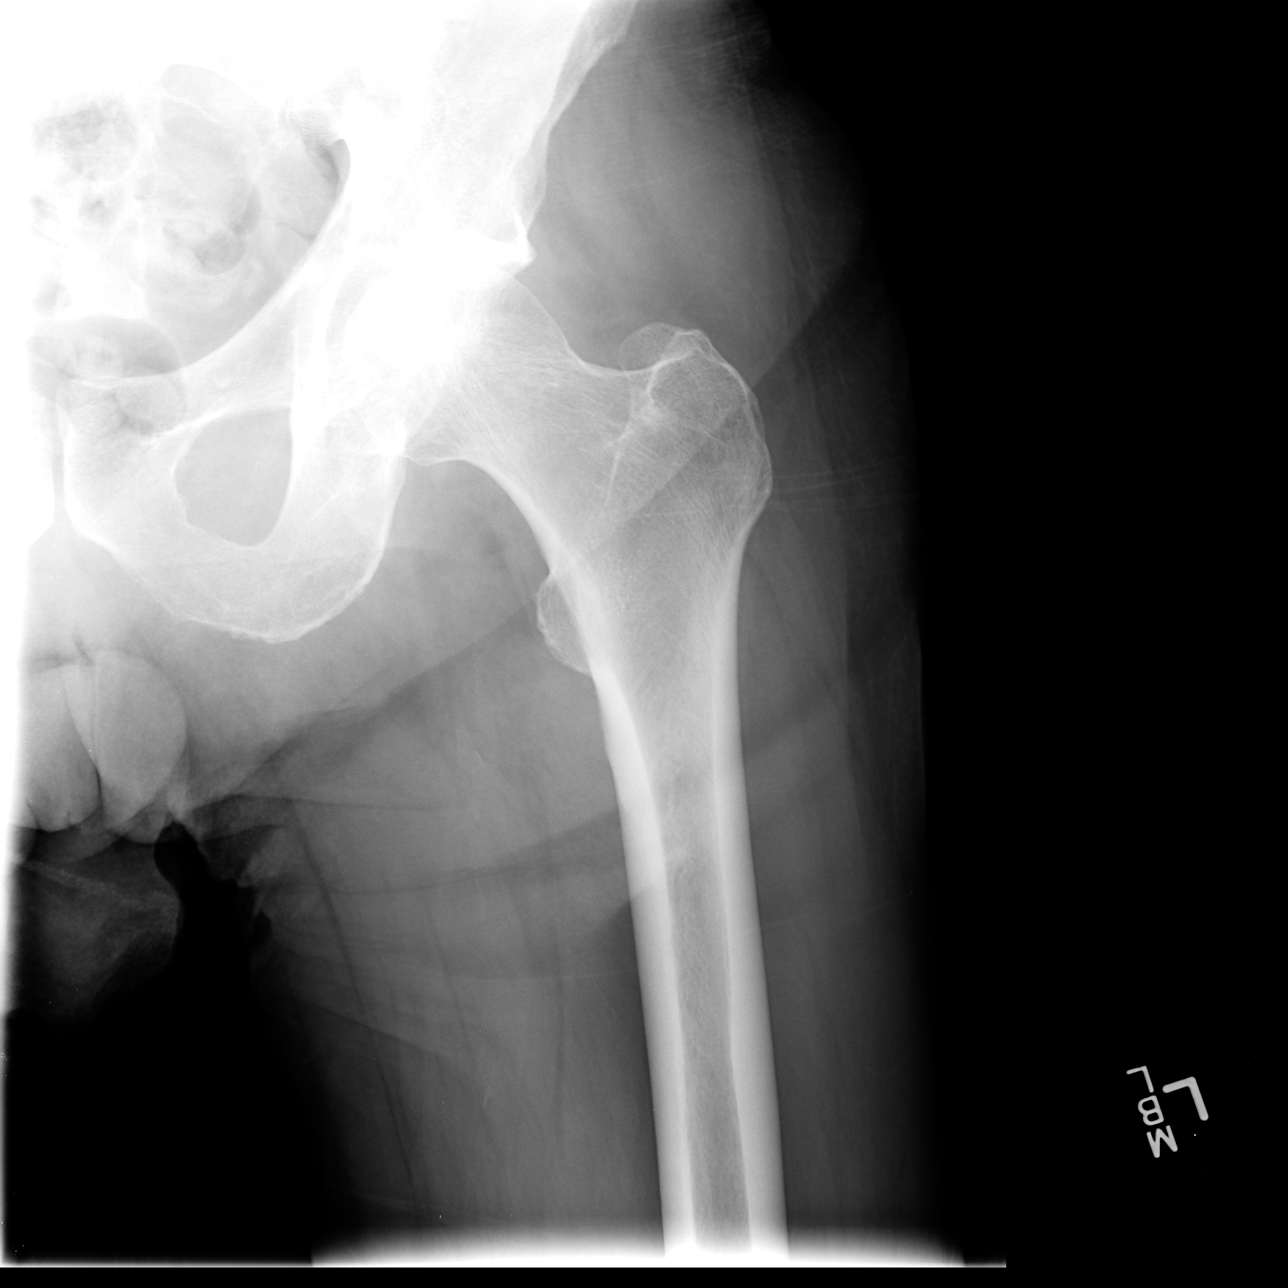

[view not recorded (3 of 3)]
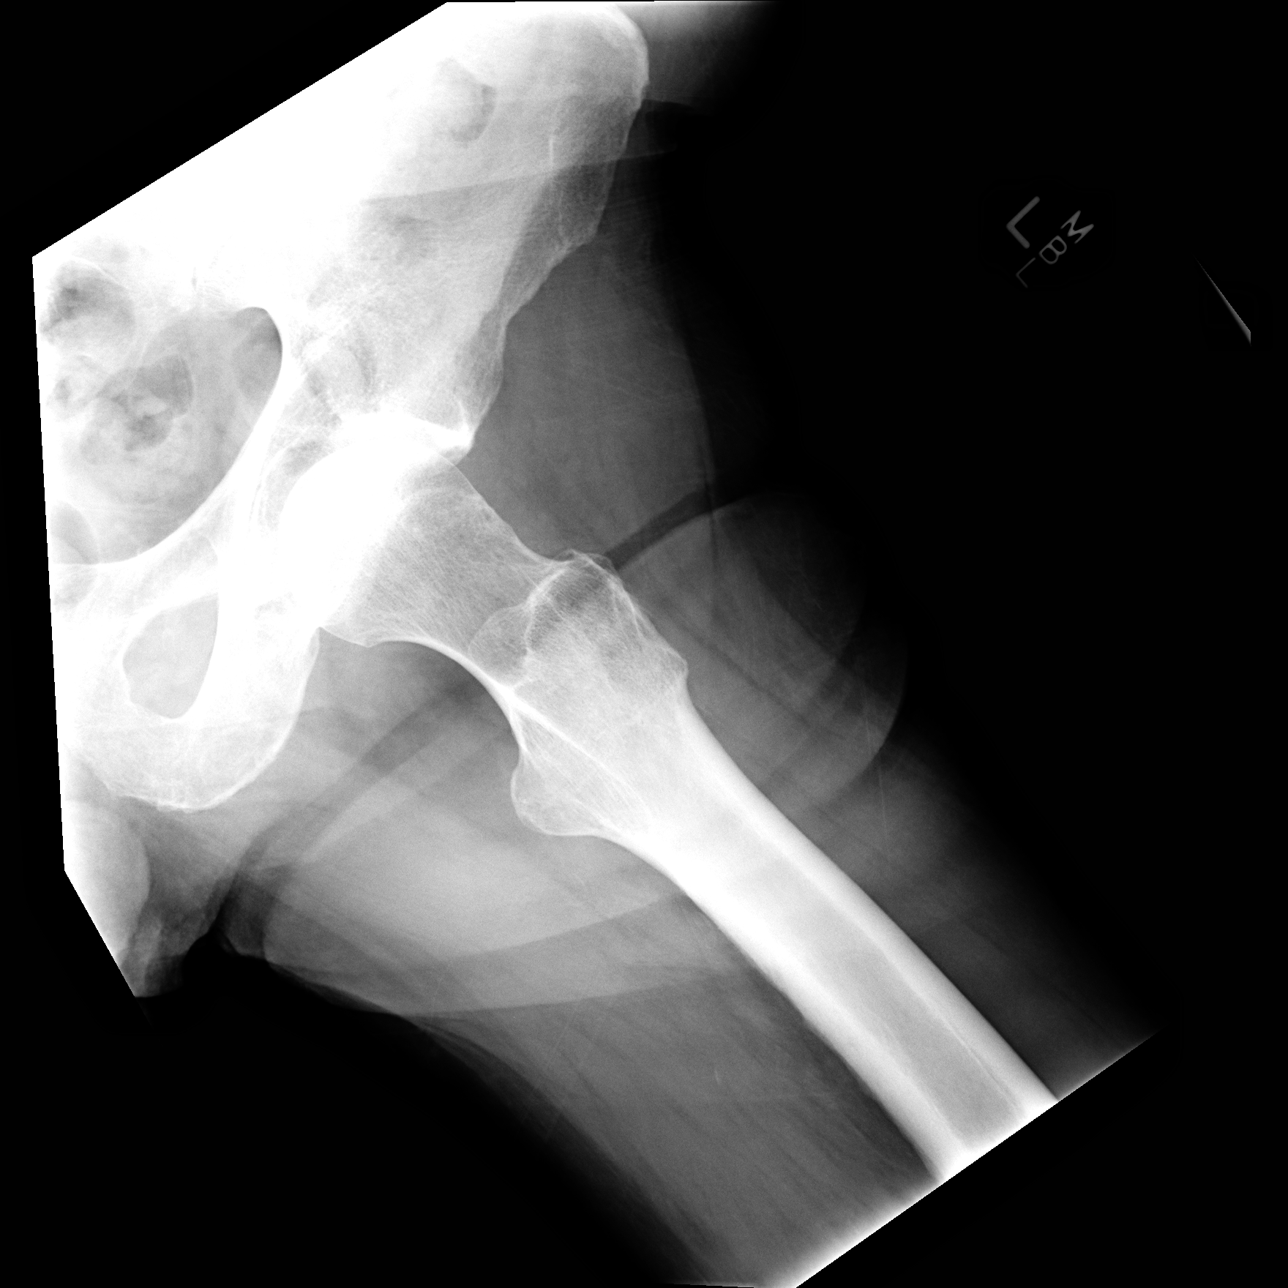

[3 of 3 positions shown; findings below may reference images not displayed]

FINDINGS: There is no evidence of hip fracture or dislocation. There is no
evidence of arthropathy or other focal bone abnormality.
IMPRESSION: Negative.

## 2016-03-25 ENCOUNTER — Ambulatory Visit: Payer: Medicare Other | Admitting: Neurology

## 2016-03-25 ENCOUNTER — Telehealth: Payer: Self-pay | Admitting: Neurology

## 2016-03-25 NOTE — Telephone Encounter (Signed)
Spoke with patient's wife. They have moved to [place] and are going to follow up with a Dr. Julaine Fusi that is closer to them.

## 2016-04-24 DEATH — deceased
